# Patient Record
Sex: Male | Born: 1955 | Race: Black or African American | Hispanic: No | Marital: Married | Smoking: Former smoker
Health system: Southern US, Community
[De-identification: ages and names within clinical notes are randomized; demographics above are authoritative.]

## PROBLEM LIST (undated history)

## (undated) DIAGNOSIS — E785 Hyperlipidemia, unspecified: Secondary | ICD-10-CM

## (undated) DIAGNOSIS — I10 Essential (primary) hypertension: Secondary | ICD-10-CM

## (undated) DIAGNOSIS — E119 Type 2 diabetes mellitus without complications: Secondary | ICD-10-CM

## (undated) DIAGNOSIS — N189 Chronic kidney disease, unspecified: Secondary | ICD-10-CM

## (undated) HISTORY — DX: Chronic kidney disease, unspecified: N18.9

## (undated) HISTORY — DX: Essential (primary) hypertension: I10

## (undated) HISTORY — PX: ESOPHAGOGASTRODUODENOSCOPY: SHX1529

## (undated) HISTORY — DX: Hyperlipidemia, unspecified: E78.5

## (undated) HISTORY — DX: Type 2 diabetes mellitus without complications: E11.9

---

## 1999-06-10 ENCOUNTER — Emergency Department (HOSPITAL_COMMUNITY): Admission: EM | Admit: 1999-06-10 | Discharge: 1999-06-11 | Payer: Self-pay

## 2000-12-12 ENCOUNTER — Inpatient Hospital Stay (HOSPITAL_COMMUNITY): Admission: EM | Admit: 2000-12-12 | Discharge: 2000-12-13 | Payer: Self-pay | Admitting: Orthopedic Surgery

## 2002-08-04 ENCOUNTER — Encounter: Payer: Self-pay | Admitting: Family Medicine

## 2002-08-04 ENCOUNTER — Encounter: Admission: RE | Admit: 2002-08-04 | Discharge: 2002-08-04 | Payer: Self-pay | Admitting: Family Medicine

## 2009-01-25 ENCOUNTER — Encounter: Admission: RE | Admit: 2009-01-25 | Discharge: 2009-01-25 | Payer: Self-pay | Admitting: Family Medicine

## 2009-11-11 ENCOUNTER — Encounter: Admission: RE | Admit: 2009-11-11 | Discharge: 2009-11-11 | Payer: Self-pay | Admitting: Gastroenterology

## 2010-09-02 NOTE — Op Note (Signed)
[facility]  Patient:    Jay Reynolds, Jay Reynolds Visit Number: [id number] MRN: [id number]          Service Type: SUR Location: [id number] Attending Physician:  Marlowe Kays Page Proc. Date: 12/11/00 Adm. Date:  12/11/2000                             Operative Report  PREOPERATIVE DIAGNOSIS:  Subacute tear, right Achilles tendon.  POSTOPERATIVE DIAGNOSIS:  Subacute tear, right Achilles tendon.  OPERATION PERFORMED:  Repair of subacute Achilles tendon tear, right.  SURGEON:  Illene Labrador. [place], M.D.  ASSISTANT:  Nurse.  ANESTHESIA:  General.  PATHOLOGY AND JUSTIFICATION FOR PROCEDURE:  He injured it playing basketball about four weeks ago because of pain and limitation of activity. His primary care physician sent him for an MRI which demonstrated a complete tear which was confirmed at surgery as discussed below.  DESCRIPTION OF PROCEDURE:  Satisfactory general anesthesia, pneumatic tourniquet applied and leg esmarched out while he was in the supine position. We then placed him in the prone position and I prepped the leg above the knee in case I had to do a V-Y plasty to close the defect. The right leg from knee to toes was draped in a sterile field. I marked out the incision starting medially to the heel cord and then coming up and gently turned a lazy S incision curving over to the midline just above the tear site. The wound was opened to about mid calf initially. The sural nerve was identified and protected. The rupture site was identified. There were two strips of roughly 1-2 cm tendinous tissue medially and laterally which were in continuity but the rest of the tendon was completely disrupted with the bulbous end to the distal portion. I first placed the ankle in a equinus to see if the gap from the tear would close down and it would, so I then elected not to do the slide procedure with the V-Y plasty. I did extend the incision proximally  enough that I had adequate tendinous tissue for grafting later on as discussed below. I detached the medial and lateral slips of tendon distally and then flayed open the proximal stump and then placed the distal bulb up into the flayed open proximal portion after having freed up both sides of the Achilles tendon to get maximum excursion. I then stabilized this with an initial #1 Ethibond Kessler stitch. I then supplemented this by advancing the medial and lateral flaps of tendinous tissue distally and suturing them to each other and to the main body of the Achilles tendon with multiple interrupted #1 Ethibond sutures. Next, I marked out two flaps of tendinous tissue starting 2 cm from the repair site and extending about 8 cm proximally with each tongue of tissue being a centimeter wide. These were then rotated downward and I closed the interval of the donor site in each case with running #1 Vicryl. I then reattached these two strips which were long enough to go well past the repair site medially and laterally and also covering the posterior surface with multiple interrupted and running #1 Vicryl. This gave a very nice thick supported repair. The wound was then irrigated with sterile saline. He was given 3 mg of Toradol IV. What was left of the tendon sheath, I was able to approximate with interrupted 2-0 Vicryl with the superficial subcutaneous tissue with 3-0 Vicryl and staples to the  skin. Betadine, Adaptic ABD pads and a well padded short leg plaster cast were then applied with the ankle in maximum equinus. The tourniquet was then released. The patient tolerated the procedure well and at the time of this dictation was on his way to the recovery room in satisfactory condition with no known complications. Attending Physician:  Joaquin Courts DD:  12/11/00 TD:  12/11/00 Job: [id number]

## 2013-01-28 ENCOUNTER — Other Ambulatory Visit: Payer: Self-pay | Admitting: Family Medicine

## 2013-01-28 ENCOUNTER — Ambulatory Visit
Admission: RE | Admit: 2013-01-28 | Discharge: 2013-01-28 | Disposition: A | Discharge status: Home / Self Care | Payer: 59 | Source: Ambulatory Visit | Attending: Family Medicine | Admitting: Family Medicine

## 2013-01-28 DIAGNOSIS — S6991XA Unspecified injury of right wrist, hand and finger(s), initial encounter: Secondary | ICD-10-CM

## 2015-10-14 DIAGNOSIS — Z0000 Encounter for general adult medical examination without abnormal findings: Secondary | ICD-10-CM | POA: Diagnosis not present

## 2015-10-14 DIAGNOSIS — I10 Essential (primary) hypertension: Secondary | ICD-10-CM | POA: Diagnosis not present

## 2015-10-14 DIAGNOSIS — E782 Mixed hyperlipidemia: Secondary | ICD-10-CM | POA: Diagnosis not present

## 2015-10-14 DIAGNOSIS — E1122 Type 2 diabetes mellitus with diabetic chronic kidney disease: Secondary | ICD-10-CM | POA: Diagnosis not present

## 2015-10-14 DIAGNOSIS — Z125 Encounter for screening for malignant neoplasm of prostate: Secondary | ICD-10-CM | POA: Diagnosis not present

## 2015-11-04 DIAGNOSIS — R0789 Other chest pain: Secondary | ICD-10-CM | POA: Diagnosis not present

## 2015-11-04 DIAGNOSIS — I10 Essential (primary) hypertension: Secondary | ICD-10-CM | POA: Diagnosis not present

## 2015-12-13 DIAGNOSIS — I10 Essential (primary) hypertension: Secondary | ICD-10-CM | POA: Diagnosis not present

## 2016-06-13 DIAGNOSIS — I10 Essential (primary) hypertension: Secondary | ICD-10-CM | POA: Diagnosis not present

## 2016-06-13 DIAGNOSIS — N529 Male erectile dysfunction, unspecified: Secondary | ICD-10-CM | POA: Diagnosis not present

## 2016-12-11 DIAGNOSIS — Z79899 Other long term (current) drug therapy: Secondary | ICD-10-CM | POA: Diagnosis not present

## 2016-12-11 DIAGNOSIS — E1122 Type 2 diabetes mellitus with diabetic chronic kidney disease: Secondary | ICD-10-CM | POA: Diagnosis not present

## 2016-12-11 DIAGNOSIS — Z0000 Encounter for general adult medical examination without abnormal findings: Secondary | ICD-10-CM | POA: Diagnosis not present

## 2017-01-10 DIAGNOSIS — D126 Benign neoplasm of colon, unspecified: Secondary | ICD-10-CM | POA: Diagnosis not present

## 2017-01-10 DIAGNOSIS — K5730 Diverticulosis of large intestine without perforation or abscess without bleeding: Secondary | ICD-10-CM | POA: Diagnosis not present

## 2017-01-10 DIAGNOSIS — Z86010 Personal history of colonic polyps: Secondary | ICD-10-CM | POA: Diagnosis not present

## 2017-01-10 DIAGNOSIS — K640 First degree hemorrhoids: Secondary | ICD-10-CM | POA: Diagnosis not present

## 2017-01-16 DIAGNOSIS — D126 Benign neoplasm of colon, unspecified: Secondary | ICD-10-CM | POA: Diagnosis not present

## 2017-06-05 DIAGNOSIS — M25561 Pain in right knee: Secondary | ICD-10-CM | POA: Diagnosis not present

## 2017-06-05 DIAGNOSIS — S83411A Sprain of medial collateral ligament of right knee, initial encounter: Secondary | ICD-10-CM | POA: Diagnosis not present

## 2017-06-05 DIAGNOSIS — Z6832 Body mass index (BMI) 32.0-32.9, adult: Secondary | ICD-10-CM | POA: Diagnosis not present

## 2017-12-25 DIAGNOSIS — I10 Essential (primary) hypertension: Secondary | ICD-10-CM | POA: Diagnosis not present

## 2017-12-25 DIAGNOSIS — E1122 Type 2 diabetes mellitus with diabetic chronic kidney disease: Secondary | ICD-10-CM | POA: Diagnosis not present

## 2017-12-25 DIAGNOSIS — Z0000 Encounter for general adult medical examination without abnormal findings: Secondary | ICD-10-CM | POA: Diagnosis not present

## 2017-12-25 DIAGNOSIS — E782 Mixed hyperlipidemia: Secondary | ICD-10-CM | POA: Diagnosis not present

## 2018-01-16 DIAGNOSIS — R7989 Other specified abnormal findings of blood chemistry: Secondary | ICD-10-CM | POA: Diagnosis not present

## 2018-01-16 DIAGNOSIS — R002 Palpitations: Secondary | ICD-10-CM | POA: Diagnosis not present

## 2019-02-13 DIAGNOSIS — E782 Mixed hyperlipidemia: Secondary | ICD-10-CM | POA: Diagnosis not present

## 2019-02-13 DIAGNOSIS — I10 Essential (primary) hypertension: Secondary | ICD-10-CM | POA: Diagnosis not present

## 2019-02-13 DIAGNOSIS — Z0000 Encounter for general adult medical examination without abnormal findings: Secondary | ICD-10-CM | POA: Diagnosis not present

## 2019-02-13 DIAGNOSIS — E1122 Type 2 diabetes mellitus with diabetic chronic kidney disease: Secondary | ICD-10-CM | POA: Diagnosis not present

## 2019-02-20 ENCOUNTER — Other Ambulatory Visit: Payer: Self-pay

## 2019-02-20 ENCOUNTER — Encounter: Payer: Self-pay | Admitting: Cardiology

## 2019-02-20 ENCOUNTER — Ambulatory Visit (INDEPENDENT_AMBULATORY_CARE_PROVIDER_SITE_OTHER): Payer: BC Managed Care – PPO | Admitting: Cardiology

## 2019-02-20 VITALS — BP 122/88 | HR 94 | Ht 72.0 in | Wt 236.0 lb

## 2019-02-20 DIAGNOSIS — R002 Palpitations: Secondary | ICD-10-CM | POA: Diagnosis not present

## 2019-02-20 DIAGNOSIS — E785 Hyperlipidemia, unspecified: Secondary | ICD-10-CM | POA: Diagnosis not present

## 2019-02-20 DIAGNOSIS — R072 Precordial pain: Secondary | ICD-10-CM

## 2019-02-20 DIAGNOSIS — I10 Essential (primary) hypertension: Secondary | ICD-10-CM

## 2019-02-20 NOTE — Progress Notes (Signed)
Cardiology Office Note:    Date:  02/20/2019   ID:  Jay Reynolds, DOB [birth year], MRN [id number]  PCP:  Mila Palmer, MD  Cardiologist:  No primary care provider on file.  Electrophysiologist:  None   Referring MD: Mila Palmer, MD   Chief Complaint  Patient presents with  . Chest Pain  . Palpitations    History of Present Illness:    Jay Reynolds is a 63 y.o. male with a hx of type 2 diabetes, CKD stage II, hyperlipidemia, hypertension who is referred by Dr. Paulino Rily for evaluation of palpitations and chest pain.  Reports that he has been getting a sensation in his chest that he describes as fluttering.  Has been occurring for past 1 year.  Occurs every day.  States that it feels like his chest is fluttering.  Occurs at rest.  However, he states that he recently had different type of chest pain that occurred with exertion.  Describes that he had burning in his chest that occurred when he went for a walk.  He walks for 3 miles every day.  Denies any shortness of breath.  He has diet-controlled diabetes.  He reports that his blood pressure has been controlled on his current regimen but can be as high as 140s over 90s when he checks at home.  He quit smoking in 1982, smoked 0.5 packs/day x 7 years.  In regards to family history, he reports that his father had CHF and his brother had a CABG in his 29s.    Past Medical History:  Diagnosis Date  . Chronic kidney disease   . Diabetes mellitus without complication (HCC)   . Hyperlipidemia   . Hypertension     History reviewed. No pertinent surgical history.  Current Medications: Current Meds  Medication Sig  . amLODipine-olmesartan (AZOR) 5-40 MG tablet amlodipine 5 mg-olmesartan 40 mg tablet  1 tablet daily  . hydrochlorothiazide (HYDRODIURIL) 25 MG tablet hydrochlorothiazide 25 mg tablet  1 tablet daily  . Milk Thistle 150 MG CAPS milk thistle 150 mg capsule  Take 1 capsule every day by oral route.     Allergies:    Patient has no known allergies.   Social History   Socioeconomic History  . Marital status: Married    Spouse name: Not on file  . Number of children: Not on file  . Years of education: Not on file  . Highest education level: Not on file  Occupational History  . Not on file  Social Needs  . Financial resource strain: Not on file  . Food insecurity    Worry: Not on file    Inability: Not on file  . Transportation needs    Medical: Not on file    Non-medical: Not on file  Tobacco Use  . Smoking status: Former Smoker    Quit date: 1982    Years since quitting: 38.8  Substance and Sexual Activity  . Alcohol use: Not on file  . Drug use: Not on file  . Sexual activity: Not on file  Lifestyle  . Physical activity    Days per week: Not on file    Minutes per session: Not on file  . Stress: Not on file  Relationships  . Social Musician on phone: Not on file    Gets together: Not on file    Attends religious service: Not on file    Active member of club or organization: Not on file  Attends meetings of clubs or organizations: Not on file    Relationship status: Not on file  Other Topics Concern  . Not on file  Social History Narrative  . Not on file     Family History: Father had CHF and his brother had a CABG in his 83s.  ROS:   Please see the history of present illness.    All other systems reviewed and are negative.  EKGs/Labs/Other Studies Reviewed:    The following studies were reviewed today:  EKG:  EKG is ordered today.  The ekg ordered today demonstrates NSR, rate 94, PVC  Recent Labs: No results found for requested labs within last 8760 hours.  Recent Lipid Panel No results found for: CHOL, TRIG, HDL, CHOLHDL, VLDL, LDLCALC, LDLDIRECT  Physical Exam:    VS:  BP 122/88   Pulse 94   Ht 6' (1.829 m)   Wt 236 lb (107 kg)   SpO2 96%   BMI 32.01 kg/m     Wt Readings from Last 3 Encounters:  02/20/19 236 lb (107 kg)     GEN: Well  nourished, well developed in no acute distress HEENT: Normal NECK: No JVD LYMPHATICS: No lymphadenopathy CARDIAC: RRR, no murmurs, rubs, gallops RESPIRATORY:  Clear to auscultation without rales, wheezing or rhonchi  ABDOMEN: Soft, non-tender, non-distended MUSCULOSKELETAL:  No edema; No deformity  SKIN: Warm and dry NEUROLOGIC:  Alert and oriented x 3 PSYCHIATRIC:  Normal affect   ASSESSMENT:    1. Precordial pain   2. Palpitations   3. Essential hypertension   4. Hyperlipidemia, unspecified hyperlipidemia type    PLAN:    In order of problems listed above:  Chest pain: Atypical in description, as describes a burning sensation that occurred with exertion.  However given the pain was exertional, and considering his risk factors (including age, hypertension, type 2 diabetes, hyperlipidemia), warrants further evaluation for obstructive coronary disease -Stress MPI -TTE  Palpitations: Describes a fluttering feeling in chest, concerning for arrhythmia.  Will check Zio patch x3 days  Hypertension: On amlodipine olmesartan 5-40 mg daily, hydrochlorothiazide 25 mg daily.  Appears controlled  T2DM: A1c 6.6.  Diet controlled  Hyperlipidemia: LDL 112, not on a statin.  Given history of diabetes, will start on atorvastatin 40 mg daily  RTC in 3 months  Medication Adjustments/Labs and Tests Ordered: Current medicines are reviewed at length with the patient today.  Concerns regarding medicines are outlined above.  Orders Placed This Encounter  Procedures  . Myocardial Perfusion Imaging  . LONG TERM MONITOR (3-14 DAYS)  . EKG 12-Lead  . ECHOCARDIOGRAM COMPLETE   No orders of the defined types were placed in this encounter.   Patient Instructions  Medication Instructions:  Continue same medications   Lab Work: None  ordered   Testing/Procedures:  Schedule Echo  Schedule Lexiscan Myoview  Schedule 3 day Zio Monitor   Follow-Up: At [facility], you and your health  needs are our priority.  As part of our continuing mission to provide you with exceptional heart care, we have created designated Provider Care Teams.  These Care Teams include your primary Cardiologist (physician) and Advanced Practice Providers (APPs -  Physician Assistants and Nurse Practitioners) who all work together to provide you with the care you need, when you need it.  Your next appointment:  3 months   The format for your next appointment:  Office   Provider:  Dr.Naquita Nappier      Signed, Donato Heinz, MD  02/20/2019  9:18 PM    [facility]

## 2019-02-20 NOTE — Patient Instructions (Signed)
Medication Instructions:  Continue same medications   Lab Work: None  ordered   Testing/Procedures:  Schedule Echo  Schedule Lexiscan Myoview  Schedule 3 day Zio Monitor   Follow-Up: At [facility], you and your health needs are our priority.  As part of our continuing mission to provide you with exceptional heart care, we have created designated Provider Care Teams.  These Care Teams include your primary Cardiologist (physician) and Advanced Practice Providers (APPs -  Physician Assistants and Nurse Practitioners) who all work together to provide you with the care you need, when you need it.  Your next appointment:  3 months   The format for your next appointment:  Office   Provider:  Dr.Schumann

## 2019-02-27 ENCOUNTER — Telehealth (HOSPITAL_COMMUNITY): Payer: Self-pay

## 2019-02-27 ENCOUNTER — Ambulatory Visit (HOSPITAL_COMMUNITY): Payer: BC Managed Care – PPO | Attending: Cardiology

## 2019-02-27 ENCOUNTER — Other Ambulatory Visit: Payer: Self-pay

## 2019-02-27 DIAGNOSIS — R002 Palpitations: Secondary | ICD-10-CM | POA: Insufficient documentation

## 2019-02-27 DIAGNOSIS — R072 Precordial pain: Secondary | ICD-10-CM

## 2019-02-27 NOTE — Telephone Encounter (Signed)
Encounter complete. 

## 2019-03-04 ENCOUNTER — Other Ambulatory Visit: Payer: Self-pay

## 2019-03-04 ENCOUNTER — Ambulatory Visit (HOSPITAL_COMMUNITY)
Admission: RE | Admit: 2019-03-04 | Payer: BC Managed Care – PPO | Source: Ambulatory Visit | Attending: Cardiology | Admitting: Cardiology

## 2019-03-04 ENCOUNTER — Encounter: Payer: Self-pay | Admitting: *Deleted

## 2019-03-05 ENCOUNTER — Telehealth: Payer: Self-pay | Admitting: *Deleted

## 2019-03-05 NOTE — Telephone Encounter (Signed)
3 day ZIO XT long term holter monitor to be mailed to the patients home.  Please apply monitor after Nuclear Medicine study on 03/11/2019 as the monitor cannot be removed and re-applied for other procedures.  Instructions included in the monitor kit.

## 2019-03-06 ENCOUNTER — Telehealth (HOSPITAL_COMMUNITY): Payer: Self-pay

## 2019-03-06 NOTE — Telephone Encounter (Signed)
Encounter complete. 

## 2019-03-11 ENCOUNTER — Other Ambulatory Visit: Payer: Self-pay

## 2019-03-11 ENCOUNTER — Ambulatory Visit (HOSPITAL_COMMUNITY)
Admission: RE | Admit: 2019-03-11 | Discharge: 2019-03-11 | Disposition: A | Discharge status: Home / Self Care | Payer: BC Managed Care – PPO | Source: Ambulatory Visit | Attending: Cardiology | Admitting: Cardiology

## 2019-03-11 DIAGNOSIS — R072 Precordial pain: Secondary | ICD-10-CM | POA: Insufficient documentation

## 2019-03-11 LAB — MYOCARDIAL PERFUSION IMAGING
LV dias vol: 115 mL (ref 62–150)
LV sys vol: 61 mL
Peak HR: 98 {beats}/min
Rest HR: 61 {beats}/min
SDS: 1
SRS: 2
SSS: 3
TID: 1.24

## 2019-03-11 MED ORDER — TECHNETIUM TC 99M TETROFOSMIN IV KIT
9.1000 | PACK | Freq: Once | INTRAVENOUS | Status: AC | PRN
  Administered 2019-03-11: 9.1 via INTRAVENOUS
  Filled 2019-03-11: qty 10

## 2019-03-11 MED ORDER — AMINOPHYLLINE 25 MG/ML IV SOLN
150.0000 mg | Freq: Once | INTRAVENOUS | Status: AC
  Administered 2019-03-11: 150 mg via INTRAVENOUS

## 2019-03-11 MED ORDER — TECHNETIUM TC 99M TETROFOSMIN IV KIT
28.8000 | PACK | Freq: Once | INTRAVENOUS | Status: AC | PRN
  Administered 2019-03-11: 28.8 via INTRAVENOUS
  Filled 2019-03-11: qty 29

## 2019-03-11 MED ORDER — REGADENOSON 0.4 MG/5ML IV SOLN
0.4000 mg | Freq: Once | INTRAVENOUS | Status: AC
  Administered 2019-03-11: 0.4 mg via INTRAVENOUS

## 2019-03-14 ENCOUNTER — Ambulatory Visit (INDEPENDENT_AMBULATORY_CARE_PROVIDER_SITE_OTHER): Payer: BC Managed Care – PPO

## 2019-03-14 DIAGNOSIS — R072 Precordial pain: Secondary | ICD-10-CM

## 2019-03-14 DIAGNOSIS — R002 Palpitations: Secondary | ICD-10-CM

## 2019-03-17 ENCOUNTER — Telehealth: Payer: Self-pay | Admitting: Cardiology

## 2019-03-17 NOTE — Telephone Encounter (Signed)
Patient returning call back for results

## 2019-03-17 NOTE — Telephone Encounter (Signed)
Pt updated with test results  

## 2019-03-17 NOTE — Telephone Encounter (Signed)
Patient is returning call about stress test results.

## 2019-03-29 DIAGNOSIS — R002 Palpitations: Secondary | ICD-10-CM | POA: Diagnosis not present

## 2019-03-29 DIAGNOSIS — R072 Precordial pain: Secondary | ICD-10-CM | POA: Diagnosis not present

## 2019-06-02 ENCOUNTER — Ambulatory Visit: Payer: BC Managed Care – PPO | Admitting: Cardiology

## 2019-06-29 NOTE — Progress Notes (Signed)
Cardiology Office Note:    Date:  07/01/2019   ID:  Jay Reynolds, DOB Jan 03, 1956, MRN [id number]  PCP:  Jonathon Jordan, MD  Cardiologist:  No primary care provider on file.  Electrophysiologist:  None   Referring MD: Jonathon Jordan, MD   No chief complaint on file.   History of Present Illness:    Jay Reynolds is a 64 y.o. male with a hx of type 2 diabetes, CKD stage II, hyperlipidemia, hypertension who presents for follow-up.  He was referred by Dr. Stephanie Acre for evaluation of palpitations and chest pain.  Reports that he has been getting a sensation in his chest that he describes as fluttering.  Has been occurring for past 1 year.  Occurs every day.  States that it feels like his chest is fluttering.  Occurs at rest.  However, he states that he recently had different type of chest pain that occurred with exertion.  Describes that he had burning in his chest that occurred when he went for a walk.  He walks for 3 miles every day.  Denies any shortness of breath.  He has diet-controlled diabetes.  He reports that his blood pressure has been controlled on his current regimen but can be as high as 140s over 90s when he checks at home.  He quit smoking in 1982, smoked 0.5 packs/day x 7 years.  In regards to family history, he reports that his father had CHF and his brother had a CABG in his 65s.  Lexiscan Myoview on 03/11/2019 showed EF 47%, fixed anteroseptal perfusion defect (suspected to be artifact given normal wall motion in this area).  TTE 02/27/2019 showed EF 60 to 65%, no significant valvular disease.  Zio patch x3 days on 04/04/2019 showed no significant arrhythmias, occasional PVCs (1.1% of beats).  Since last clinic visit, he reports that his chest pain has improved.  Does continue to have occasional chest pain, which he describes as tightness in his chest that occurs after eating.  Reports that Tums helps with pain.  States that his palpitations have improved, denies any recent  palpitations.  He has been walking 3 miles 5 days/week and denies any exertional chest pain.    Past Medical History:  Diagnosis Date  . Chronic kidney disease   . Diabetes mellitus without complication ([place])   . Hyperlipidemia   . Hypertension     History reviewed. No pertinent surgical history.  Current Medications: Current Meds  Medication Sig  . amLODipine-olmesartan (AZOR) 5-40 MG tablet amlodipine 5 mg-olmesartan 40 mg tablet  1 tablet daily  . aspirin EC 81 MG tablet Take 81 mg by mouth daily.  . hydrochlorothiazide (HYDRODIURIL) 25 MG tablet hydrochlorothiazide 25 mg tablet  1 tablet daily  . Milk Thistle 150 MG CAPS milk thistle 150 mg capsule  Take 1 capsule every day by oral route.  . Misc Natural Products (GLUCOSAMINE CHOND CMP ADVANCED PO) Take by mouth.  . Multiple Vitamin (MULTIVITAMIN PO) Take by mouth.     Allergies:   Antihistamines, diphenhydramine-type   Social History   Socioeconomic History  . Marital status: Married    Spouse name: Not on file  . Number of children: Not on file  . Years of education: Not on file  . Highest education level: Not on file  Occupational History  . Not on file  Tobacco Use  . Smoking status: Former Smoker    Quit date: 1982    Years since quitting: 39.2  . Smokeless tobacco:  Never Used  Substance and Sexual Activity  . Alcohol use: Not on file  . Drug use: Never  . Sexual activity: Not on file  Other Topics Concern  . Not on file  Social History Narrative  . Not on file   Social Determinants of Health   Financial Resource Strain:   . Difficulty of Paying Living Expenses:   Food Insecurity:   . Worried About Programme researcher, broadcasting/film/video in the Last Year:   . Barista in the Last Year:   Transportation Needs:   . Freight forwarder (Medical):   Marland Kitchen Lack of Transportation (Non-Medical):   Physical Activity:   . Days of Exercise per Week:   . Minutes of Exercise per Session:   Stress:   . Feeling of  Stress :   Social Connections:   . Frequency of Communication with Friends and Family:   . Frequency of Social Gatherings with Friends and Family:   . Attends Religious Services:   . Active Member of Clubs or Organizations:   . Attends Banker Meetings:   Marland Kitchen Marital Status:      Family History: Father had CHF and his brother had a CABG in his 58s.  ROS:   Please see the history of present illness.    All other systems reviewed and are negative.  EKGs/Labs/Other Studies Reviewed:    The following studies were reviewed today:  EKG:  EKG is ordered today.  The ekg ordered today demonstrates NSR, rate 94, PVC  Cardiac monitor 04/04/2019:  No significant arrhythmias detected  Occasional PVCs (1.1% of beats), with episode of bigeminy lasting 6.5 seconds  No patient triggered events   3 days of data recorded on Zio monitor. Patient had a min HR of 58 bpm, max HR of 140 bpm, and avg HR of 90 bpm. Predominant underlying rhythm was Sinus Rhythm. No VT, SVT, atrial fibrillation, high degree block, or pauses noted. Isolated atrial ectopy was rare (<1%).  Ventricular ectopy was occasional (1.1%).  Ventricular bigemny was present, longest lasting 6.5 seconds.   There were 0 triggered events. No significant arrhythmias detected.  Lexiscan Myoview 03/11/2019:  There was no ST segment deviation noted during stress.  Nuclear stress EF: 47%.  The left ventricular ejection fraction is mildly decreased (45-54%).  Defect 1: There is a medium fixed defect of mild severity present in the basal anteroseptal, mid anteroseptal, apical septal and apex location.  Findings consistent with prior myocardial infarction versus artifact. Normal wall motion suggests more likely artifact  This is an intermediate risk study given decreased systolic function. No ischemia.   Fixed anteroseptal perfusion defect.  Normal wall motion in this area suggests artifact.  Mild systolic dysfunction, but  normal function on recent TTE   TTE 02/27/19: 1. The average left ventricular global longitudinal strain is normal at  -18.6 %.  2. Left ventricular ejection fraction, by visual estimation, is 60 to  65%. The left ventricle has normal function. There is no left ventricular  hypertrophy.  3. Left ventricular diastolic parameters are indeterminate.  4. Global right ventricle has normal systolic function.The right  ventricular size is normal. No increase in right ventricular wall  thickness.  5. Left atrial size was normal.  6. Right atrial size was normal.  7. The mitral valve is normal in structure. Trace mitral valve  regurgitation. No evidence of mitral stenosis.  8. The tricuspid valve is normal in structure. Tricuspid valve  regurgitation is not  demonstrated.  9. The aortic valve is normal in structure. Aortic valve regurgitation is  not visualized. No evidence of aortic valve sclerosis or stenosis.  10. The pulmonic valve was normal in structure. Pulmonic valve  regurgitation is not visualized.  11. The inferior vena cava is normal in size with greater than 50%  respiratory variability, suggesting right atrial pressure of 3 mmHg.   Recent Labs: No results found for requested labs within last 8760 hours.  Recent Lipid Panel No results found for: CHOL, TRIG, HDL, CHOLHDL, VLDL, LDLCALC, LDLDIRECT  Physical Exam:    VS:  BP 130/84   Pulse 72   Temp 97.9 F (36.6 C)   Ht 6' (1.829 m)   Wt 240 lb (108.9 kg)   SpO2 96%   BMI 32.55 kg/m     Wt Readings from Last 3 Encounters:  07/01/19 240 lb (108.9 kg)  03/11/19 236 lb (107 kg)  02/20/19 236 lb (107 kg)     GEN: Well nourished, well developed in no acute distress HEENT: Normal NECK: No JVD LYMPHATICS: No lymphadenopathy CARDIAC: RRR, no murmurs, rubs, gallops RESPIRATORY:  Clear to auscultation without rales, wheezing or rhonchi  ABDOMEN: Soft, non-tender, non-distended MUSCULOSKELETAL:  No edema; No  deformity  SKIN: Warm and dry NEUROLOGIC:  Alert and oriented x 3 PSYCHIATRIC:  Normal affect   ASSESSMENT:    1. Precordial pain   2. Palpitations   3. Essential hypertension   4. Hyperlipidemia, unspecified hyperlipidemia type    PLAN:    In order of problems listed above:  Chest pain: Atypical in description.  No ischemia on Lexiscan Myoview.  TTE shows no significant abnormalities.  Suspect GERD, will start on omeprazole 20 mg daily  Palpitations: Describes a fluttering feeling in chest.  Zio patch showed no significant arrhythmias, occasional PVCs (1.1% of beats).  He reports no recent symptoms.  Hypertension: On amlodipine olmesartan 5-40 mg daily, hydrochlorothiazide 25 mg daily.  Appears controlled  T2DM: A1c 6.6.  Diet controlled  Hyperlipidemia: LDL 112.  Given history of diabetes, start atorvastatin 40 mg daily   RTC in 3 months  Medication Adjustments/Labs and Tests Ordered: Current medicines are reviewed at length with the patient today.  Concerns regarding medicines are outlined above.  No orders of the defined types were placed in this encounter.  Meds ordered this encounter  Medications  . atorvastatin (LIPITOR) 40 MG tablet    Sig: Take 1 tablet (40 mg total) by mouth daily.    Dispense:  90 tablet    Refill:  3  . omeprazole (PRILOSEC) 20 MG capsule    Sig: Take 1 capsule (20 mg total) by mouth daily.    Dispense:  90 capsule    Refill:  3    Patient Instructions  Medication Instructions:  START atorvastatin (Lipitor) 40 mg daily START omeprazole 20 mg daily  *If you need a refill on your cardiac medications before your next appointment, please call your pharmacy*   Lab Work: NONE  Testing/Procedures: NONE  Follow-Up: At [facility], you and your health needs are our priority.  As part of our continuing mission to provide you with exceptional heart care, we have created designated Provider Care Teams.  These Care Teams include your  primary Cardiologist (physician) and Advanced Practice Providers (APPs -  Physician Assistants and Nurse Practitioners) who all work together to provide you with the care you need, when you need it.  We recommend signing up for the patient portal called "  MyChart".  Sign up information is provided on this After Visit Summary.  MyChart is used to connect with patients for Virtual Visits (Telemedicine).  Patients are able to view lab/test results, encounter notes, upcoming appointments, etc.  Non-urgent messages can be sent to your provider as well.   To learn more about what you can do with MyChart, go to [email or web address].    Your next appointment:   3 month(s)  The format for your next appointment:   In Person  Provider:   Epifanio Lesches, MD        Signed, Little Ishikawa, MD  07/01/2019 9:24 AM    [facility]

## 2019-07-01 ENCOUNTER — Ambulatory Visit (INDEPENDENT_AMBULATORY_CARE_PROVIDER_SITE_OTHER): Payer: BC Managed Care – PPO | Admitting: Cardiology

## 2019-07-01 ENCOUNTER — Other Ambulatory Visit: Payer: Self-pay

## 2019-07-01 ENCOUNTER — Encounter: Payer: Self-pay | Admitting: Cardiology

## 2019-07-01 VITALS — BP 130/84 | HR 72 | Temp 97.9°F | Ht 72.0 in | Wt 240.0 lb

## 2019-07-01 DIAGNOSIS — I10 Essential (primary) hypertension: Secondary | ICD-10-CM | POA: Diagnosis not present

## 2019-07-01 DIAGNOSIS — R002 Palpitations: Secondary | ICD-10-CM

## 2019-07-01 DIAGNOSIS — R072 Precordial pain: Secondary | ICD-10-CM

## 2019-07-01 DIAGNOSIS — E785 Hyperlipidemia, unspecified: Secondary | ICD-10-CM | POA: Diagnosis not present

## 2019-07-01 MED ORDER — OMEPRAZOLE 20 MG PO CPDR: 20.0000 mg | DELAYED_RELEASE_CAPSULE | Freq: Every day | ORAL | 3 refills | Status: DC

## 2019-07-01 MED ORDER — ATORVASTATIN CALCIUM 40 MG PO TABS: 40.0000 mg | ORAL_TABLET | Freq: Every day | ORAL | 3 refills | Status: DC

## 2019-07-01 NOTE — Patient Instructions (Signed)
Medication Instructions:  START atorvastatin (Lipitor) 40 mg daily START omeprazole 20 mg daily  *If you need a refill on your cardiac medications before your next appointment, please call your pharmacy*   Lab Work: NONE  Testing/Procedures: NONE  Follow-Up: At [facility], you and your health needs are our priority.  As part of our continuing mission to provide you with exceptional heart care, we have created designated Provider Care Teams.  These Care Teams include your primary Cardiologist (physician) and Advanced Practice Providers (APPs -  Physician Assistants and Nurse Practitioners) who all work together to provide you with the care you need, when you need it.  We recommend signing up for the patient portal called "MyChart".  Sign up information is provided on this After Visit Summary.  MyChart is used to connect with patients for Virtual Visits (Telemedicine).  Patients are able to view lab/test results, encounter notes, upcoming appointments, etc.  Non-urgent messages can be sent to your provider as well.   To learn more about what you can do with MyChart, go to [email or web address].    Your next appointment:   3 month(s)  The format for your next appointment:   In Person  Provider:   Epifanio Lesches, MD

## 2019-09-28 NOTE — Progress Notes (Signed)
Cardiology Office Note:    Date:  09/28/2019   ID:  Hazel Sams, DOB 1955/07/28, MRN [id number]  PCP:  Mila Palmer, MD  Cardiologist:  No primary care provider on file.  Electrophysiologist:  None   Referring MD: Mila Palmer, MD   No chief complaint on file.   History of Present Illness:    Jay Reynolds is a 64 y.o. male with a hx of type 2 diabetes, CKD stage II, hyperlipidemia, hypertension who presents for follow-up.  He was referred by Dr. Paulino Rily for evaluation of palpitations and chest pain.  Reports that he has been getting a sensation in his chest that he describes as fluttering.  Has been occurring for past 1 year.  Occurs every day.  States that it feels like his chest is fluttering.  Occurs at rest.  However, he states that he recently had different type of chest pain that occurred with exertion.  Describes that he had burning in his chest that occurred when he went for a walk.  He walks for 3 miles every day.  Denies any shortness of breath.  He has diet-controlled diabetes.  He reports that his blood pressure has been controlled on his current regimen but can be as high as 140s over 90s when he checks at home.  He quit smoking in 1982, smoked 0.5 packs/day x 7 years.  In regards to family history, he reports that his father had CHF and his brother had a CABG in his 23s.  Lexiscan Myoview on 03/11/2019 showed EF 47%, fixed anteroseptal perfusion defect (suspected to be artifact given normal wall motion in this area).  TTE 02/27/2019 showed EF 60 to 65%, no significant valvular disease.  Zio patch x3 days on 04/04/2019 showed no significant arrhythmias, occasional PVCs (1.1% of beats).  Since last clinic visit, he reports that he continues to have chest pain occurring nearly every day.  States that it feels like indigestion.  Still has palpitations several times per week lasting 1 to 2 minutes.  He continues to walk 3 miles 5 days/week.  Denies any exertional  symptoms.  Denies any lightheadedness or syncope.  Reports BP has been normal when he checks at home.  He did not start taking his statin or PPI as prescribed.    Past Medical History:  Diagnosis Date  . Chronic kidney disease   . Diabetes mellitus without complication (HCC)   . Hyperlipidemia   . Hypertension     No past surgical history on file.  Current Medications: No outpatient medications have been marked as taking for the 10/01/19 encounter (Appointment) with Little Ishikawa, MD.     Allergies:   Antihistamines, diphenhydramine-type   Social History   Socioeconomic History  . Marital status: Married    Spouse name: Not on file  . Number of children: Not on file  . Years of education: Not on file  . Highest education level: Not on file  Occupational History  . Not on file  Tobacco Use  . Smoking status: Former Smoker    Quit date: 1982    Years since quitting: 39.4  . Smokeless tobacco: Never Used  Vaping Use  . Vaping Use: Never used  Substance and Sexual Activity  . Alcohol use: Not on file  . Drug use: Never  . Sexual activity: Not on file  Other Topics Concern  . Not on file  Social History Narrative  . Not on file   Social Determinants of Health  Financial Resource Strain:   . Difficulty of Paying Living Expenses:   Food Insecurity:   . Worried About Charity fundraiser in the Last Year:   . Arboriculturist in the Last Year:   Transportation Needs:   . Film/video editor (Medical):   Marland Kitchen Lack of Transportation (Non-Medical):   Physical Activity:   . Days of Exercise per Week:   . Minutes of Exercise per Session:   Stress:   . Feeling of Stress :   Social Connections:   . Frequency of Communication with Friends and Family:   . Frequency of Social Gatherings with Friends and Family:   . Attends Religious Services:   . Active Member of Clubs or Organizations:   . Attends Archivist Meetings:   Marland Kitchen Marital Status:       Family History: Father had CHF and his brother had a CABG in his 59s.  ROS:   Please see the history of present illness.    All other systems reviewed and are negative.  EKGs/Labs/Other Studies Reviewed:    The following studies were reviewed today:  EKG:  EKG is ordered today.  The ekg ordered today demonstrates NSR, rate 70, no ST/T abnormalities  Cardiac monitor 04/04/2019:  No significant arrhythmias detected  Occasional PVCs (1.1% of beats), with episode of bigeminy lasting 6.5 seconds  No patient triggered events   3 days of data recorded on Zio monitor. Patient had a min HR of 58 bpm, max HR of 140 bpm, and avg HR of 90 bpm. Predominant underlying rhythm was Sinus Rhythm. No VT, SVT, atrial fibrillation, high degree block, or pauses noted. Isolated atrial ectopy was rare (<1%).  Ventricular ectopy was occasional (1.1%).  Ventricular bigemny was present, longest lasting 6.5 seconds.   There were 0 triggered events. No significant arrhythmias detected.  Lexiscan Myoview 03/11/2019:  There was no ST segment deviation noted during stress.  Nuclear stress EF: 47%.  The left ventricular ejection fraction is mildly decreased (45-54%).  Defect 1: There is a medium fixed defect of mild severity present in the basal anteroseptal, mid anteroseptal, apical septal and apex location.  Findings consistent with prior myocardial infarction versus artifact. Normal wall motion suggests more likely artifact  This is an intermediate risk study given decreased systolic function. No ischemia.   Fixed anteroseptal perfusion defect.  Normal wall motion in this area suggests artifact.  Mild systolic dysfunction, but normal function on recent TTE   TTE 02/27/19: 1. The average left ventricular global longitudinal strain is normal at  -18.6 %.  2. Left ventricular ejection fraction, by visual estimation, is 60 to  65%. The left ventricle has normal function. There is no left ventricular   hypertrophy.  3. Left ventricular diastolic parameters are indeterminate.  4. Global right ventricle has normal systolic function.The right  ventricular size is normal. No increase in right ventricular wall  thickness.  5. Left atrial size was normal.  6. Right atrial size was normal.  7. The mitral valve is normal in structure. Trace mitral valve  regurgitation. No evidence of mitral stenosis.  8. The tricuspid valve is normal in structure. Tricuspid valve  regurgitation is not demonstrated.  9. The aortic valve is normal in structure. Aortic valve regurgitation is  not visualized. No evidence of aortic valve sclerosis or stenosis.  10. The pulmonic valve was normal in structure. Pulmonic valve  regurgitation is not visualized.  11. The inferior vena cava is normal in  size with greater than 50%  respiratory variability, suggesting right atrial pressure of 3 mmHg.   Recent Labs: No results found for requested labs within last 8760 hours.  Recent Lipid Panel No results found for: CHOL, TRIG, HDL, CHOLHDL, VLDL, LDLCALC, LDLDIRECT  Physical Exam:    VS:  There were no vitals taken for this visit.    Wt Readings from Last 3 Encounters:  07/01/19 240 lb (108.9 kg)  03/11/19 236 lb (107 kg)  02/20/19 236 lb (107 kg)     GEN: Well nourished, well developed in no acute distress HEENT: Normal NECK: No JVD CARDIAC: RRR, no murmurs, rubs, gallops RESPIRATORY:  Clear to auscultation without rales, wheezing or rhonchi  ABDOMEN: Soft, non-tender, non-distended MUSCULOSKELETAL:  No edema; No deformity  SKIN: Warm and dry NEUROLOGIC:  Alert and oriented x 3 PSYCHIATRIC:  Normal affect   ASSESSMENT:    No diagnosis found. PLAN:    Chest pain: Atypical in description.  No ischemia on Lexiscan Myoview.  TTE shows no significant abnormalities.  Concern for GI etiology, will refer to GI for evaluation.  Palpitations: Describes a fluttering feeling in chest.  Zio patch showed  no significant arrhythmias, occasional PVCs (1.1% of beats).   Hypertension: On amlodipine ,olmesartan 5-40 mg daily, hydrochlorothiazide 25 mg daily.  Appears controlled  T2DM: A1c 6.6.  Diet controlled  Hyperlipidemia: LDL 112.  Given history of diabetes, started atorvastatin 40 mg daily on 07/01/2019.  He reports that he did not start taking atorvastatin, recommended starting.  RTC in 6 months  Medication Adjustments/Labs and Tests Ordered: Current medicines are reviewed at length with the patient today.  Concerns regarding medicines are outlined above.  No orders of the defined types were placed in this encounter.  No orders of the defined types were placed in this encounter.   There are no Patient Instructions on file for this visit.   Signed, Little Ishikawa, MD  09/28/2019 4:48 PM    [facility]

## 2019-10-01 ENCOUNTER — Encounter: Payer: Self-pay | Admitting: Gastroenterology

## 2019-10-01 ENCOUNTER — Encounter: Payer: Self-pay | Admitting: Cardiology

## 2019-10-01 ENCOUNTER — Other Ambulatory Visit: Payer: Self-pay

## 2019-10-01 ENCOUNTER — Ambulatory Visit (INDEPENDENT_AMBULATORY_CARE_PROVIDER_SITE_OTHER): Payer: BC Managed Care – PPO | Admitting: Cardiology

## 2019-10-01 VITALS — BP 126/88 | HR 70 | Ht 72.0 in | Wt 232.6 lb

## 2019-10-01 DIAGNOSIS — K219 Gastro-esophageal reflux disease without esophagitis: Secondary | ICD-10-CM | POA: Diagnosis not present

## 2019-10-01 DIAGNOSIS — R072 Precordial pain: Secondary | ICD-10-CM | POA: Diagnosis not present

## 2019-10-01 NOTE — Patient Instructions (Signed)
Medication Instructions:  Your physician recommends that you continue on your current medications as directed. Please refer to the Current Medication list given to you today.  *If you need a refill on your cardiac medications before your next appointment, please call your pharmacy*  Follow-Up: At [facility], you and your health needs are our priority.  As part of our continuing mission to provide you with exceptional heart care, we have created designated Provider Care Teams.  These Care Teams include your primary Cardiologist (physician) and Advanced Practice Providers (APPs -  Physician Assistants and Nurse Practitioners) who all work together to provide you with the care you need, when you need it.  We recommend signing up for the patient portal called "MyChart".  Sign up information is provided on this After Visit Summary.  MyChart is used to connect with patients for Virtual Visits (Telemedicine).  Patients are able to view lab/test results, encounter notes, upcoming appointments, etc.  Non-urgent messages can be sent to your provider as well.   To learn more about what you can do with MyChart, go to [email or web address].com.    Your next appointment:   6 month(s)  The format for your next appointment:   In Person  Provider:   Christopher Schumann, MD   Other Instructions You have been referred to: Gastroenterology   

## 2019-11-05 ENCOUNTER — Encounter: Payer: Self-pay | Admitting: Gastroenterology

## 2019-11-05 ENCOUNTER — Ambulatory Visit (INDEPENDENT_AMBULATORY_CARE_PROVIDER_SITE_OTHER): Payer: BC Managed Care – PPO | Admitting: Gastroenterology

## 2019-11-05 VITALS — BP 120/78 | HR 66 | Ht 72.0 in | Wt 231.0 lb

## 2019-11-05 DIAGNOSIS — R1013 Epigastric pain: Secondary | ICD-10-CM | POA: Insufficient documentation

## 2019-11-05 DIAGNOSIS — R0789 Other chest pain: Secondary | ICD-10-CM | POA: Diagnosis not present

## 2019-11-05 DIAGNOSIS — R6881 Early satiety: Secondary | ICD-10-CM | POA: Diagnosis not present

## 2019-11-05 MED ORDER — PANTOPRAZOLE SODIUM 40 MG PO TBEC: 40.0000 mg | DELAYED_RELEASE_TABLET | Freq: Every day | ORAL | 5 refills | Status: DC

## 2019-11-05 NOTE — Patient Instructions (Signed)
If you are age 64 or older, your body mass index should be between 23-30. Your Body mass index is 31.33 kg/m. If this is out of the aforementioned range listed, please consider follow up with your Primary Care Provider.  If you are age 2 or younger, your body mass index should be between 19-25. Your Body mass index is 31.33 kg/m. If this is out of the aformentioned range listed, please consider follow up with your Primary Care Provider.   You have been scheduled for an endoscopy. Please follow written instructions given to you at your visit today. If you use inhalers (even only as needed), please bring them with you on the day of your procedure.  START Pantoprazole 40 mg 1 tablet daily. This has been sent to [facility] on [address].  Follow up pending the results of your Endoscopy or as needed.

## 2019-11-05 NOTE — Progress Notes (Signed)
11/05/2019 Jay Reynolds [id number] 11-Mar-1956   HISTORY OF PRESENT ILLNESS: This is a 64 year old male who is new to our office.  He has been referred here by Dr. Bjorn Pippin from cardiology for evaluation regarding atypical chest pain that they think may be from GERD.  He tells me that he has been experiencing pressure sensation in his chest and epigastric abdominal discomfort for several months.  He saw cardiology initially back in March and they did extensive work-up that has been unremarkable.  He says that the pain is consistent, every day, but does come and go throughout the day.  He says that they prescribed Prilosec, which he took for 6 days, but he felt like it made his symptoms worse so he discontinued it.  He also describes what sounds like early satiety, saying that he feels full with lesser amounts of food than he used to.  He denies any unintentional weight loss.  No dysphagia or odynophagia.  He tells me that he gets his colonoscopies every 3 years at [facility].  Sounds like he plans to continue to go there to have those performed.  He says that he moves his bowels regularly without issues.  No evidence of dark or bloody stools.   Past Medical History:  Diagnosis Date  . Chronic kidney disease   . Diabetes mellitus without complication (HCC)   . Hyperlipidemia   . Hypertension    History reviewed. No pertinent surgical history.  reports that he quit smoking about 39 years ago. He has never used smokeless tobacco. He reports that he does not use drugs. No history on file for alcohol use. family history is not on file. Allergies  Allergen Reactions  . Antihistamines, Diphenhydramine-Type     Pt cant remember reaction.      Outpatient Encounter Medications as of 11/05/2019  Medication Sig  . amLODipine-olmesartan (AZOR) 5-40 MG tablet amlodipine 5 mg-olmesartan 40 mg tablet  1 tablet daily  . aspirin EC 81 MG tablet Take 81 mg by mouth daily.  . hydrochlorothiazide  (HYDRODIURIL) 25 MG tablet hydrochlorothiazide 25 mg tablet  1 tablet daily  . Milk Thistle 150 MG CAPS milk thistle 150 mg capsule  Take 1 capsule every day by oral route.  . Misc Natural Products (GLUCOSAMINE CHOND CMP ADVANCED PO) Take by mouth.  . Multiple Vitamin (MULTIVITAMIN PO) Take by mouth.  . [DISCONTINUED] omeprazole (PRILOSEC) 20 MG capsule Take 1 capsule (20 mg total) by mouth daily.   No facility-administered encounter medications on file as of 11/05/2019.     REVIEW OF SYSTEMS  : All other systems reviewed and negative except where noted in the History of Present Illness.   PHYSICAL EXAM: BP 120/78   Pulse 66   Ht 6' (1.829 m)   Wt 231 lb (104.8 kg)   BMI 31.33 kg/m  General: Well developed white male in no acute distress Head: Normocephalic and atraumatic Eyes:  Sclerae anicteric, conjunctiva pink. Ears: Normal auditory acuity Lungs: Clear throughout to auscultation; no increased WOB. Heart: Regular rate and rhythm; no M/R/G. Abdomen: Soft, non-distended.  BS present.  Non-tender. Musculoskeletal: Symmetrical with no gross deformities  Skin: No lesions on visible extremities Extremities: No edema  Neurological: Alert oriented x 4, grossly non-focal Psychological:  Alert and cooperative. Normal mood and affect  ASSESSMENT AND PLAN: *64 year old male with complaints of atypical chest pain and epigastric abdominal pain.  Also reports feeling full and bloated in the upper abdomen.  Saw cardiology with  negative work-up.  Took omeprazole for couple of days, but felt worse so discontinued it.  We will plan for EGD for further evaluation with Dr. Orvan Falconer, rule out esophagitis, etc.  I think it is worth trying another PPI so I am prescribing pantoprazole 40 mg daily.  I am leaving up to the patient as to whether he wants to try the medication before his procedure or wait until following his procedure and seeing results.  Prescription sent to pharmacy.  If EGD is  unremarkable is unremarkable and he has no improvement on PPI would suggest CT scan of the abdomen for further evaluation.  **The risks, benefits, and alternatives to EGD were discussed with the patient and he consents to proceed.   CC:  Little Ishikawa*

## 2019-11-05 NOTE — Progress Notes (Signed)
Reviewed and agree with management plans. ? ?Kimberly L. Beavers, MD, MPH  ?

## 2019-11-10 ENCOUNTER — Other Ambulatory Visit: Payer: Self-pay

## 2019-11-10 ENCOUNTER — Ambulatory Visit (AMBULATORY_SURGERY_CENTER): Payer: BC Managed Care – PPO | Admitting: Gastroenterology

## 2019-11-10 ENCOUNTER — Encounter: Payer: Self-pay | Admitting: Gastroenterology

## 2019-11-10 VITALS — BP 121/81 | HR 64 | Temp 97.5°F | Resp 18 | Ht 72.0 in | Wt 231.0 lb

## 2019-11-10 DIAGNOSIS — R109 Unspecified abdominal pain: Secondary | ICD-10-CM | POA: Diagnosis not present

## 2019-11-10 DIAGNOSIS — B9681 Helicobacter pylori [H. pylori] as the cause of diseases classified elsewhere: Secondary | ICD-10-CM | POA: Diagnosis not present

## 2019-11-10 DIAGNOSIS — K2970 Gastritis, unspecified, without bleeding: Secondary | ICD-10-CM

## 2019-11-10 DIAGNOSIS — K2950 Unspecified chronic gastritis without bleeding: Secondary | ICD-10-CM | POA: Diagnosis not present

## 2019-11-10 DIAGNOSIS — R0789 Other chest pain: Secondary | ICD-10-CM

## 2019-11-10 DIAGNOSIS — R1013 Epigastric pain: Secondary | ICD-10-CM

## 2019-11-10 MED ORDER — SODIUM CHLORIDE 0.9 % IV SOLN: 500.0000 mL | Freq: Once | INTRAVENOUS | Status: DC

## 2019-11-10 NOTE — Progress Notes (Signed)
pt tolerated well. VSS. awake and to recovery. Report given to RN. Bite block inserted and removed without trauma. 

## 2019-11-10 NOTE — Progress Notes (Signed)
Called to room to assist during endoscopic procedure.  Patient ID and intended procedure confirmed with present staff. Received instructions for my participation in the procedure from the performing physician.  

## 2019-11-10 NOTE — Patient Instructions (Signed)
Thank you for allowing [place] to care for you today!  Resume previous diet and medications today. Continue present medications, but increase Pantoprazole to twice per day for 12 weeks, then reduce to once daily.  Avoid Ibuprofen and naproxen, opt for Tylenol for pain and fever.   May continue with the daily baby aspirin.  Await pathology results, approximately 2 weeks.  We will call to set up a follow-up appointment with Dr Orvan Falconer.     YOU HAD AN ENDOSCOPIC PROCEDURE TODAY AT THE [facility]:   Refer to the procedure report that was given to you for any specific questions about what was found during the examination.  If the procedure report does not answer your questions, please call your gastroenterologist to clarify.  If you requested that your care partner not be given the details of your procedure findings, then the procedure report has been included in a sealed envelope for you to review at your convenience later.  YOU SHOULD EXPECT: Some feelings of bloating in the abdomen. Passage of more gas than usual.  Walking can help get rid of the air that was put into your GI tract during the procedure and reduce the bloating. If you had a lower endoscopy (such as a colonoscopy or flexible sigmoidoscopy) you may notice spotting of blood in your stool or on the toilet paper. If you underwent a bowel prep for your procedure, you may not have a normal bowel movement for a few days.  Please Note:  You might notice some irritation and congestion in your nose or some drainage.  This is from the oxygen used during your procedure.  There is no need for concern and it should clear up in a day or so.  SYMPTOMS TO REPORT IMMEDIATELY:    Following upper endoscopy (EGD)  Vomiting of blood or coffee ground material  New chest pain or pain under the shoulder blades  Painful or persistently difficult swallowing  New shortness of breath  Fever of 100F or higher  Black, tarry-looking stools  For  urgent or emergent issues, a gastroenterologist can be reached at any hour by calling [phone]. Do not use MyChart messaging for urgent concerns.    DIET:  We do recommend a small meal at first, but then you may proceed to your regular diet.  Drink plenty of fluids but you should avoid alcoholic beverages for 24 hours.  ACTIVITY:  You should plan to take it easy for the rest of today and you should NOT DRIVE or use heavy machinery until tomorrow (because of the sedation medicines used during the test).    FOLLOW UP: Our staff will call the number listed on your records 48-72 hours following your procedure to check on you and address any questions or concerns that you may have regarding the information given to you following your procedure. If we do not reach you, we will leave a message.  We will attempt to reach you two times.  During this call, we will ask if you have developed any symptoms of COVID 19. If you develop any symptoms (ie: fever, flu-like symptoms, shortness of breath, cough etc.) before then, please call [phone].  If you test positive for Covid 19 in the 2 weeks post procedure, please call and report this information to [place].    If any biopsies were taken you will be contacted by phone or by letter within the next 1-3 weeks.  Please call us at [phone] if you have not  heard about the biopsies in 3 weeks.    SIGNATURES/CONFIDENTIALITY: You and/or your care partner have signed paperwork which will be entered into your electronic medical record.  These signatures attest to the fact that that the information above on your After Visit Summary has been reviewed and is understood.  Full responsibility of the confidentiality of this discharge information lies with you and/or your care-partner.

## 2019-11-10 NOTE — Op Note (Signed)
[facility] Patient Name: Roxan HockeyJohn Freilich Procedure Date: 11/10/2019 9:34 AM MRN: [id number] Endoscopist: Tressia DanasKimberly Biana Haggar MD, MD Age: 64 Referring MD:  Date of Birth: 05/28/1955 Gender: Male Account #: [id number] Procedure:                Upper GI endoscopy Indications:              Epigastric abdominal pain, Chest pain (non cardiac) Medicines:                Monitored Anesthesia Care Procedure:                Pre-Anesthesia Assessment:                           - Prior to the procedure, a History and Physical                            was performed, and patient medications and                            allergies were reviewed. The patient's tolerance of                            previous anesthesia was also reviewed. The risks                            and benefits of the procedure and the sedation                            options and risks were discussed with the patient.                            All questions were answered, and informed consent                            was obtained. Prior Anticoagulants: The patient has                            taken no previous anticoagulant or antiplatelet                            agents. ASA Grade Assessment: III - A patient with                            severe systemic disease. After reviewing the risks                            and benefits, the patient was deemed in                            satisfactory condition to undergo the procedure.                           After obtaining informed consent, the endoscope was  passed under direct vision. Throughout the                            procedure, the patient's blood pressure, pulse, and                            oxygen saturations were monitored continuously. The                            Endoscope was introduced through the mouth, and                            advanced to the third part of duodenum. The upper                            GI  endoscopy was accomplished without difficulty.                            The patient tolerated the procedure well. Scope In: Scope Out: Findings:                 The examined esophagus was normal. Biopsies were                            obtained from the proximal and distal esophagus                            with cold forceps for histology.                           Diffuse moderate inflammation characterized by                            erythema, friability and granularity was found in                            the gastric fundus. Biopsies were taken from the                            antrum, body, and fundus with a cold forceps for                            histology. Estimated blood loss was minimal.                           The examined duodenum was normal.                           The cardia and gastric fundus were normal on                            retroflexion.                           The exam was otherwise without abnormality. Complications:  No immediate complications. Estimated blood loss:                            Minimal. Estimated Blood Loss:     Estimated blood loss was minimal. Impression:               - Normal esophagus. Biopsied.                           - Gastritis. Biopsied.                           - Normal examined duodenum.                           - The examination was otherwise normal. Recommendation:           - Patient has a contact number available for                            emergencies. The signs and symptoms of potential                            delayed complications were discussed with the                            patient. Return to normal activities tomorrow.                            Written discharge instructions were provided to the                            patient.                           - Resume previous diet.                           - Continue present medications. Increase                             pantoprazole to 40 mg BID for at least 12 weeks,                            then reduce to 40 mg daily.                           - No aspirin, ibuprofen, naproxen, or other                            non-steroidal anti-inflammatory drugs.                           - Await pathology results.                           - Follow-up in the office with Dr. Orvan Falconer to  review these results. Tressia Danas MD, MD 11/10/2019 9:57:22 AM This report has been signed electronically.

## 2019-11-10 NOTE — Progress Notes (Signed)
Pt's states no medical or surgical changes since previsit or office visit.  CW - vitals 

## 2019-11-11 ENCOUNTER — Telehealth: Payer: Self-pay | Admitting: *Deleted

## 2019-11-11 NOTE — Telephone Encounter (Signed)
Please schedule a follow-up visit with me or Shanda Bumps - the next available when the schedule opens up. Thank you.

## 2019-11-11 NOTE — Telephone Encounter (Signed)
Appointment letter mailed to patient for 11/25/19 11:30 Am with Rito Ehrlich, PA

## 2019-11-11 NOTE — Telephone Encounter (Signed)
Per procedure report, f/u OV to be scheduled with Dr. Orvan Falconer to review results of endo. No OV available at this time. Please, advise.

## 2019-11-12 ENCOUNTER — Telehealth: Payer: Self-pay

## 2019-11-12 NOTE — Telephone Encounter (Signed)
  Follow up Call-  Call back number 11/10/2019  Post procedure Call Back phone  # [phone]  Permission to leave phone message Yes  Some recent data might be hidden     Patient questions:  Do you have a fever, pain , or abdominal swelling? No. Pain Score  3 *-- Pt states some soreness in throat that is getting better. Instructed patient that the pain is expected and that if it gets worse or does not subside in a few days to give [place] a call back.   Have you tolerated food without any problems? Yes.    Have you been able to return to your normal activities? Yes.    Do you have any questions about your discharge instructions: Diet   No. Medications  No. Follow up visit  No.  Do you have questions or concerns about your Care? No.  Actions: * If pain score is 4 or above: No action needed, pain <4.   1. Have you developed a fever since your procedure? No   2.   Have you had an respiratory symptoms (SOB or cough) since your procedure? No   3.   Have you tested positive for COVID 19 since your procedure? No   4.   Have you had any family members/close contacts diagnosed with the COVID 19 since your procedure?  No   If yes to any of these questions please route to Laverna Peace, RN and Charlett Lango, RN

## 2019-11-12 NOTE — Telephone Encounter (Signed)
Attempted to reach pt. With follow-up call following endoscopic procedure 11/10/2019.  LM On pt. Voice mail.  Will try to reach pt. Again later today.

## 2019-11-17 NOTE — Telephone Encounter (Signed)
Patient returned the call is doing well no questions at this time 

## 2019-11-24 ENCOUNTER — Telehealth: Payer: Self-pay | Admitting: Gastroenterology

## 2019-11-24 MED ORDER — PANTOPRAZOLE SODIUM 40 MG PO TBEC: 40.0000 mg | DELAYED_RELEASE_TABLET | Freq: Every day | ORAL | 5 refills | Status: DC

## 2019-11-24 NOTE — Telephone Encounter (Signed)
Refills have been sent to patient's pharmacy. °

## 2019-11-25 ENCOUNTER — Ambulatory Visit: Payer: BC Managed Care – PPO | Admitting: Gastroenterology

## 2019-11-26 ENCOUNTER — Ambulatory Visit (INDEPENDENT_AMBULATORY_CARE_PROVIDER_SITE_OTHER): Payer: BC Managed Care – PPO | Admitting: Gastroenterology

## 2019-11-26 ENCOUNTER — Encounter: Payer: Self-pay | Admitting: Gastroenterology

## 2019-11-26 ENCOUNTER — Telehealth: Payer: Self-pay | Admitting: Gastroenterology

## 2019-11-26 VITALS — BP 130/80 | HR 75 | Ht 72.0 in | Wt 230.0 lb

## 2019-11-26 DIAGNOSIS — B9681 Helicobacter pylori [H. pylori] as the cause of diseases classified elsewhere: Secondary | ICD-10-CM

## 2019-11-26 DIAGNOSIS — K2970 Gastritis, unspecified, without bleeding: Secondary | ICD-10-CM

## 2019-11-26 MED ORDER — METRONIDAZOLE 500 MG PO TABS: 500.0000 mg | ORAL_TABLET | Freq: Four times a day (QID) | ORAL | 0 refills | Status: AC

## 2019-11-26 MED ORDER — TETRACYCLINE HCL 500 MG PO CAPS: 500.0000 mg | ORAL_CAPSULE | Freq: Four times a day (QID) | ORAL | 0 refills | Status: AC

## 2019-11-26 MED ORDER — PANTOPRAZOLE SODIUM 40 MG PO TBEC: 40.0000 mg | DELAYED_RELEASE_TABLET | Freq: Two times a day (BID) | ORAL | 4 refills | Status: AC

## 2019-11-26 NOTE — Progress Notes (Addendum)
11/26/2019 Bengie Kaucher [id number] 10-09-1955   HISTORY OF PRESENT ILLNESS: This is a 64 year old male who is a patient of Dr. Orvan Falconer.  He was initially seen by me in July for complaints of atypical chest pain and epigastric abdominal pain.  Thought to be noncardiac in origin.  Ultimately he underwent EGD with Dr. Orvan Falconer on July 26 and was found to have only gastritis.  Biopsies were positive for H. pylori.  We attempted to contact him with prescription regimen, but were not able to get in touch with him.  Currently he is only on pantoprazole 40 mg twice daily since his procedure and says that he actually has been feeling somewhat better.   Past Medical History:  Diagnosis Date  . Chronic kidney disease   . Diabetes mellitus without complication (HCC)   . Hyperlipidemia   . Hypertension    Past Surgical History:  Procedure Laterality Date  . ESOPHAGOGASTRODUODENOSCOPY      reports that he quit smoking about 39 years ago. His smoking use included cigarettes. He has never used smokeless tobacco. He reports current alcohol use of about 1.0 standard drink of alcohol per week. He reports that he does not use drugs. family history includes Heart disease in his father; Kidney disease in his mother. Allergies  Allergen Reactions  . Antihistamines, Diphenhydramine-Type     Pt cant remember reaction.      Outpatient Encounter Medications as of 11/26/2019  Medication Sig  . amLODipine-olmesartan (AZOR) 5-40 MG tablet amlodipine 5 mg-olmesartan 40 mg tablet  1 tablet daily  . aspirin EC 81 MG tablet Take 81 mg by mouth daily.  . hydrochlorothiazide (HYDRODIURIL) 25 MG tablet hydrochlorothiazide 25 mg tablet  1 tablet daily  . Milk Thistle 150 MG CAPS milk thistle 150 mg capsule  Take 1 capsule every day by oral route.  . Misc Natural Products (GLUCOSAMINE CHOND CMP ADVANCED PO) Take by mouth.  . Multiple Vitamin (MULTIVITAMIN PO) Take by mouth.  . pantoprazole (PROTONIX) 40 MG  tablet Take 40 mg by mouth 2 (two) times daily before a meal.  . [DISCONTINUED] pantoprazole (PROTONIX) 40 MG tablet Take 1 tablet (40 mg total) by mouth daily. (Patient taking differently: Take 40 mg by mouth 2 (two) times daily. )   No facility-administered encounter medications on file as of 11/26/2019.     REVIEW OF SYSTEMS  : All other systems reviewed and negative except where noted in the History of Present Illness.   PHYSICAL EXAM: BP 130/80   Pulse 75   Ht 6' (1.829 m)   Wt 230 lb (104.3 kg)   BMI 31.19 kg/m  General: Well developed AA male in no acute distress Head: Normocephalic and atraumatic Eyes:  Sclerae anicteric, conjunctiva pink. Ears: Normal auditory acuity Lungs: Clear throughout to auscultation; no increased WOB. Heart: Regular rate and rhythm; no M/R/G. Abdomen: Soft, non-distended.  BS present.  Non-tender. Musculoskeletal: Symmetrical with no gross deformities  Skin: No lesions on visible extremities Extremities: No edema  Neurological: Alert oriented x 4, grossly non-focal Psychological:  Alert and cooperative. Normal mood and affect  ASSESSMENT AND PLAN: *Hpylori gastritis:  Will treat with regimen recommended by Dr. Orvan Falconer, bismuth 300 mg four times daily, metronidazole 500 mg four times daily, tetracycline 500 mg four times daily, and pantoprazole 40 mg BID, all for 14 days.  No ETOH while on medication or for 3 days following treatment.  Will then continue pantoprazole 40 mg BID.  Will check stool  Ag for Hpylori 8 weeks post-treatment (needs to stop PPI for 2 weeks prior to collecting stool sample then will resume after stool sample is collected).  **Addendum:  Colonoscopy records received from Dr. Bosie Clos at [facility].  Colonoscopy September 2018 showed three 4 to 10 mm polyps in the descending colon removed with hot snare.  He also had diverticulosis in the sigmoid colon, descending colon, and ascending colon.  Internal hemorrhoids.  Polyps were all  tubular adenomas.  Recall recommended at 3-year interval.   CC:  Mila Palmer, MD

## 2019-11-26 NOTE — Patient Instructions (Signed)
STOP ALL ALCOHOL USE FOR 17 DAYS    STARTING 11/27/19    H. Pylori Treatment breakdown is as follows (all x 14 days, then discontinue):  Breakfast:  *Bismuth subsalicylate chewable tablet (Pepto Bismol) 262 mg--    2 tablets  *Tetracycline 500 mg tablet - 1 tablet  *Metronidazole (Flagyl) 500 mg tablet-1 tablet  *Pantoprazole (Protonix) 40 mg-1 tablet  Lunch:  *Bismuth subsalicylate chewable tablet (Pepto Bismol) 262 mg--    2 tablets  *Tetracycline 500 mg tablet - 1 tablet   Dinner: *Bismuth subsalicylate chewable tablet (Pepto Bismol) 262 mg--    2 tablets  *Tetracycline 500 mg tablet - 1 tablet  *Metronidazole (Flagyl) 500 mg tablet-1 tablet  *Pantoprazole (Protonix) 40 mg-1 tablet  Bedtime: *Bismuth subsalicylate chewable tablet (Pepto Bismol) 262 mg--   2 tablets  *Tetracycline 500 mg tablet - 1 tablet  *Metronidazole (Flagyl) 500 mg tablet-1 tablet  Around 02/05/20 or later - Eight weeks after completing the treatment please complete H pylori stool antigen should be performed to monitor for response to treatment. Will need to stop Pantoprazole 2 weeks prior to collecting sample.

## 2019-11-26 NOTE — Telephone Encounter (Signed)
Patient called and states Tetracylcine will cost $250. Please advise on next option. Dr. Orvan Falconer is out of the office.

## 2019-11-26 NOTE — Telephone Encounter (Signed)
Patient called in reference to the cost of Tetracycline

## 2019-11-28 NOTE — Telephone Encounter (Signed)
Left message for patient to call the office

## 2019-11-28 NOTE — Telephone Encounter (Signed)
Please let him know that I would prefer to get Dr. Orvan Falconer input as to what her next recommended regimen would be to treat him.  We will be back in touch with him once we hear from her.  Please forward to Dr. Orvan Falconer as well.

## 2019-12-01 NOTE — Telephone Encounter (Signed)
Left messsage informing her will call tomorrow with recommendations from Dr. Orvan Falconer.   Dr. Orvan Falconer patient came into the clinic last week to see [place], [place]. Patient was informed of positive H. Pylori and medication was sent in to start treatment. Patient called back to say the Tetracycline was to expensive and he is unable to afford it. Please advise on your next option. Read telephone contacts below.

## 2019-12-01 NOTE — Telephone Encounter (Signed)
What is your next preferred antibiotic and I will see if its covered by his insurance.

## 2019-12-01 NOTE — Telephone Encounter (Signed)
Please find out what h pylori treatment option is approved by his insurance company. Thank you.

## 2019-12-02 NOTE — Telephone Encounter (Signed)
Clarithromycin 500 mg twice daily for 14 days, amoxicillin 1 g twice daily for 14 days, and PPI twice daily for 14 days. Eight weeks after completing the treatment, an H pylori stool antigen should be performed to monitor for response to treatment.  Return to the office after completing treatment. Thank you.

## 2019-12-02 NOTE — Telephone Encounter (Signed)
Spoke with patient and he started the Tetracycline on Friday. He went ahead and paid for it.

## 2019-12-07 DIAGNOSIS — Z20822 Contact with and (suspected) exposure to covid-19: Secondary | ICD-10-CM | POA: Diagnosis not present

## 2019-12-07 DIAGNOSIS — Z03818 Encounter for observation for suspected exposure to other biological agents ruled out: Secondary | ICD-10-CM | POA: Diagnosis not present

## 2019-12-08 NOTE — Progress Notes (Signed)
Reviewed and agree with management plans. ? ?Matilyn Fehrman L. Peretz Thieme, MD, MPH  ?

## 2020-01-15 ENCOUNTER — Telehealth: Payer: Self-pay

## 2020-01-15 NOTE — Telephone Encounter (Signed)
The pt has been advised and will call back to make an appt for colon with Dr Orvan Falconer.

## 2020-01-15 NOTE — Telephone Encounter (Signed)
-----   Message from Leta Baptist, PA-C sent at 01/15/2020  4:16 PM EDT ----- We let him know that I received his records from [facility] regarding his colonoscopy.  It was performed in September 2018.  Due to the number and size of polyps that were removed they recommended a 3-year recall.  That means that he would be due currently.  We please schedule him with Dr. Orvan Falconer.   Thank you,  Jess

## 2020-02-05 ENCOUNTER — Other Ambulatory Visit: Payer: BC Managed Care – PPO

## 2020-02-05 DIAGNOSIS — K2970 Gastritis, unspecified, without bleeding: Secondary | ICD-10-CM | POA: Diagnosis not present

## 2020-02-05 DIAGNOSIS — B9681 Helicobacter pylori [H. pylori] as the cause of diseases classified elsewhere: Secondary | ICD-10-CM

## 2020-02-09 LAB — H. PYLORI ANTIGEN, STOOL: H pylori Ag, Stl: NEGATIVE

## 2020-02-24 DIAGNOSIS — E1122 Type 2 diabetes mellitus with diabetic chronic kidney disease: Secondary | ICD-10-CM | POA: Diagnosis not present

## 2020-02-24 DIAGNOSIS — E782 Mixed hyperlipidemia: Secondary | ICD-10-CM | POA: Diagnosis not present

## 2020-02-24 DIAGNOSIS — Z0000 Encounter for general adult medical examination without abnormal findings: Secondary | ICD-10-CM | POA: Diagnosis not present

## 2020-02-24 DIAGNOSIS — I10 Essential (primary) hypertension: Secondary | ICD-10-CM | POA: Diagnosis not present

## 2020-02-24 DIAGNOSIS — Z79899 Other long term (current) drug therapy: Secondary | ICD-10-CM | POA: Diagnosis not present

## 2020-02-24 DIAGNOSIS — Z125 Encounter for screening for malignant neoplasm of prostate: Secondary | ICD-10-CM | POA: Diagnosis not present

## 2020-04-11 NOTE — Progress Notes (Signed)
Cardiology Office Note:    Date:  04/12/2020   ID:  Jay Reynolds, DOB 07/16/1955, MRN [id number]  PCP:  Mila Palmer, MD  Cardiologist:  No primary care provider on file.  Electrophysiologist:  None   Referring MD: Mila Palmer, MD   Chief Complaint  Patient presents with  . Chest Pain    History of Present Illness:    Jay Reynolds is a 64 y.o. male with a hx of type 2 diabetes, CKD stage II, hyperlipidemia, hypertension who presents for follow-up.  He was referred by Dr. Paulino Rily for evaluation of palpitations and chest pain, initially seen on 02/20/2019.  Reports that he has been getting a sensation in his chest that he describes as fluttering.  Has been occurring for past 1 year.  Occurs every day.  States that it feels like his chest is fluttering.  Occurs at rest.  However, he states that he recently had different type of chest pain that occurred with exertion.  Describes that he had burning in his chest that occurred when he went for a walk.  He walks for 3 miles every day.  Denies any shortness of breath.  He has diet-controlled diabetes.  He reports that his blood pressure has been controlled on his current regimen but can be as high as 140s over 90s when he checks at home.  He quit smoking in 1982, smoked 0.5 packs/day x 7 years.  In regards to family history, he reports that his father had CHF and his brother had a CABG in his 62s.  Lexiscan Myoview on 03/11/2019 showed EF 47%, fixed anteroseptal perfusion defect (suspected to be artifact given normal wall motion in this area).  TTE 02/27/2019 showed EF 60 to 65%, no significant valvular disease.  Zio patch x3 days on 04/04/2019 showed no significant arrhythmias, occasional PVCs (1.1% of beats).  Since last clinic visit, he reports that he has been doing well.  States that his chest pain has improved significantly since his gastritis/H. pylori was treated.  States that he still feels chest pain on occasion, particularly  when he drinks beer.  Reports that he is walking 3 miles 5 days/week and denies any exertional chest pain or dyspnea.  Denies any lightheadedness, syncope, palpitations, or lower extremity edema.  Reports BP has been controlled he checks at home.  He did not start taking his statin.    Past Medical History:  Diagnosis Date  . Chronic kidney disease   . Diabetes mellitus without complication (HCC)   . Hyperlipidemia   . Hypertension     Past Surgical History:  Procedure Laterality Date  . ESOPHAGOGASTRODUODENOSCOPY      Current Medications: Current Meds  Medication Sig  . amLODipine-olmesartan (AZOR) 5-40 MG tablet amlodipine 5 mg-olmesartan 40 mg tablet  1 tablet daily  . hydrochlorothiazide (HYDRODIURIL) 25 MG tablet hydrochlorothiazide 25 mg tablet  1 tablet daily  . Milk Thistle 150 MG CAPS milk thistle 150 mg capsule  Take 1 capsule every day by oral route.  . Misc Natural Products (GLUCOSAMINE CHOND DOUBLE STR) TABS 1 tablet  . Multiple Vitamin (MULTIVITAMIN PO) Take by mouth.  . pantoprazole (PROTONIX) 40 MG tablet Take 1 tablet (40 mg total) by mouth 2 (two) times daily before a meal.  . [DISCONTINUED] aspirin EC 81 MG tablet Take 81 mg by mouth daily.     Allergies:   Antihistamines, diphenhydramine-type   Social History   Socioeconomic History  . Marital status: Married  Spouse name: Not on file  . Number of children: 2  . Years of education: Not on file  . Highest education level: Not on file  Occupational History  . Occupation: self employed  Tobacco Use  . Smoking status: Former Smoker    Types: Cigarettes    Quit date: 1982    Years since quitting: 40.0  . Smokeless tobacco: Never Used  Vaping Use  . Vaping Use: Never used  Substance and Sexual Activity  . Alcohol use: Yes    Alcohol/week: 1.0 standard drink    Types: 1 Cans of beer per week    Comment: occ  . Drug use: Never  . Sexual activity: Not on file  Other Topics Concern  . Not on  file  Social History Narrative  . Not on file   Social Determinants of Health   Financial Resource Strain: Not on file  Food Insecurity: Not on file  Transportation Needs: Not on file  Physical Activity: Not on file  Stress: Not on file  Social Connections: Not on file     Family History: Father had CHF and his brother had a CABG in his 41s.  ROS:   Please see the history of present illness.    All other systems reviewed and are negative.  EKGs/Labs/Other Studies Reviewed:    The following studies were reviewed today:  EKG:  EKG is not ordered today.  The ekg ordered at prior clinic visit demonstrates NSR, rate 70, no ST/T abnormalities  Cardiac monitor 04/04/2019:  No significant arrhythmias detected  Occasional PVCs (1.1% of beats), with episode of bigeminy lasting 6.5 seconds  No patient triggered events   3 days of data recorded on Zio monitor. Patient had a min HR of 58 bpm, max HR of 140 bpm, and avg HR of 90 bpm. Predominant underlying rhythm was Sinus Rhythm. No VT, SVT, atrial fibrillation, high degree block, or pauses noted. Isolated atrial ectopy was rare (<1%).  Ventricular ectopy was occasional (1.1%).  Ventricular bigemny was present, longest lasting 6.5 seconds.   There were 0 triggered events. No significant arrhythmias detected.  Lexiscan Myoview 03/11/2019:  There was no ST segment deviation noted during stress.  Nuclear stress EF: 47%.  The left ventricular ejection fraction is mildly decreased (45-54%).  Defect 1: There is a medium fixed defect of mild severity present in the basal anteroseptal, mid anteroseptal, apical septal and apex location.  Findings consistent with prior myocardial infarction versus artifact. Normal wall motion suggests more likely artifact  This is an intermediate risk study given decreased systolic function. No ischemia.   Fixed anteroseptal perfusion defect.  Normal wall motion in this area suggests artifact.  Mild  systolic dysfunction, but normal function on recent TTE   TTE 02/27/19: 1. The average left ventricular global longitudinal strain is normal at  -18.6 %.  2. Left ventricular ejection fraction, by visual estimation, is 60 to  65%. The left ventricle has normal function. There is no left ventricular  hypertrophy.  3. Left ventricular diastolic parameters are indeterminate.  4. Global right ventricle has normal systolic function.The right  ventricular size is normal. No increase in right ventricular wall  thickness.  5. Left atrial size was normal.  6. Right atrial size was normal.  7. The mitral valve is normal in structure. Trace mitral valve  regurgitation. No evidence of mitral stenosis.  8. The tricuspid valve is normal in structure. Tricuspid valve  regurgitation is not demonstrated.  9. The aortic  valve is normal in structure. Aortic valve regurgitation is  not visualized. No evidence of aortic valve sclerosis or stenosis.  10. The pulmonic valve was normal in structure. Pulmonic valve  regurgitation is not visualized.  11. The inferior vena cava is normal in size with greater than 50%  respiratory variability, suggesting right atrial pressure of 3 mmHg.   Recent Labs: No results found for requested labs within last 8760 hours.  Recent Lipid Panel No results found for: CHOL, TRIG, HDL, CHOLHDL, VLDL, LDLCALC, LDLDIRECT  Physical Exam:    VS:  BP (!) 124/92 (BP Location: Left Arm, Patient Position: Sitting)   Pulse 73   Ht 6' (1.829 m)   Wt 231 lb (104.8 kg)   SpO2 94%   BMI 31.33 kg/m     Wt Readings from Last 3 Encounters:  04/12/20 231 lb (104.8 kg)  11/26/19 230 lb (104.3 kg)  11/10/19 (!) 231 lb (104.8 kg)     GEN: Well nourished, well developed in no acute distress HEENT: Normal NECK: No JVD CARDIAC: RRR, no murmurs, rubs, gallops RESPIRATORY:  Clear to auscultation without rales, wheezing or rhonchi  ABDOMEN: Soft, non-tender,  non-distended MUSCULOSKELETAL:  No edema; No deformity  SKIN: Warm and dry NEUROLOGIC:  Alert and oriented x 3 PSYCHIATRIC:  Normal affect   ASSESSMENT:    1. Hyperlipidemia, unspecified hyperlipidemia type   2. Precordial pain   3. Essential hypertension   4. Palpitations    PLAN:    Chest pain: Atypical in description.  No ischemia on Lexiscan Myoview.  TTE shows no significant abnormalities.  Concern for GI etiology, referred to GI for evaluation.  EGD in July 2021 showed gastritis, biopsies positive for H. Pylori.  Reports chest pain has significantly improved since gastritis/H. pylori was treated. - Stopped aspirin  Palpitations: Describes a fluttering feeling in chest.  Zio patch showed no significant arrhythmias, occasional PVCs (1.1% of beats).  Reports palpitations have improved.  Hypertension: On amlodipine, olmesartan 5-40 mg daily, hydrochlorothiazide 25 mg daily.  DBP mildly elevated in clinic today, asked to check BP twice daily for next week and call with results  T2DM: A1c 6.6.  Diet controlled  Hyperlipidemia: LDL 121 on 02/24/2020.  Given his history of diabetes, recommend starting statin.  Previously had prescribed statin but he did not start taking.  He is now agreeable to starting statin, will prescribe rosuvastatin 10 mg daily.  Will check LFTs as does report a history of fatty liver disease.  RTC in 6 months  Medication Adjustments/Labs and Tests Ordered: Current medicines are reviewed at length with the patient today.  Concerns regarding medicines are outlined above.  Orders Placed This Encounter  Procedures  . Comprehensive metabolic panel   Meds ordered this encounter  Medications  . rosuvastatin (CRESTOR) 10 MG tablet    Sig: Take 1 tablet (10 mg total) by mouth daily.    Dispense:  90 tablet    Refill:  3    Patient Instructions  Medication Instructions:  STOP aspirin START rosuvastatin (Crestor) 10 mg daily  *If you need a refill on your  cardiac medications before your next appointment, please call your pharmacy*   Lab Work: CMET today  Lipid panel in 2-3 months-I will mail you the lab slips as a reminder.  If you have labs (blood work) drawn today and your tests are completely normal, you will receive your results only by: Marland Kitchen MyChart Message (if you have MyChart) OR . A paper copy in  the mail If you have any lab test that is abnormal or we need to change your treatment, we will call you to review the results.  Follow-Up: At [facility], you and your health needs are our priority.  As part of our continuing mission to provide you with exceptional heart care, we have created designated Provider Care Teams.  These Care Teams include your primary Cardiologist (physician) and Advanced Practice Providers (APPs -  Physician Assistants and Nurse Practitioners) who all work together to provide you with the care you need, when you need it.  We recommend signing up for the patient portal called "MyChart".  Sign up information is provided on this After Visit Summary.  MyChart is used to connect with patients for Virtual Visits (Telemedicine).  Patients are able to view lab/test results, encounter notes, upcoming appointments, etc.  Non-urgent messages can be sent to your provider as well.   To learn more about what you can do with MyChart, go to [email or web address].    Your next appointment:   6 month(s)  The format for your next appointment:   In Person  Provider:   Epifanio Lescheshristopher Callaway Hardigree, MD   Other Instructions Please check your blood pressure at home daily, write it down.  Call the office or send message via Mychart with the readings in 1 week for Dr. Bjorn PippinSchumann to review.       Signed, Little Ishikawahristopher L Anushka Hartinger, MD  04/12/2020 9:45 AM    [facility]

## 2020-04-12 ENCOUNTER — Other Ambulatory Visit: Payer: Self-pay

## 2020-04-12 ENCOUNTER — Ambulatory Visit (INDEPENDENT_AMBULATORY_CARE_PROVIDER_SITE_OTHER): Payer: BC Managed Care – PPO | Admitting: Cardiology

## 2020-04-12 ENCOUNTER — Encounter: Payer: Self-pay | Admitting: Cardiology

## 2020-04-12 VITALS — BP 124/92 | HR 73 | Ht 72.0 in | Wt 231.0 lb

## 2020-04-12 DIAGNOSIS — R002 Palpitations: Secondary | ICD-10-CM | POA: Diagnosis not present

## 2020-04-12 DIAGNOSIS — E785 Hyperlipidemia, unspecified: Secondary | ICD-10-CM

## 2020-04-12 DIAGNOSIS — I10 Essential (primary) hypertension: Secondary | ICD-10-CM

## 2020-04-12 DIAGNOSIS — R072 Precordial pain: Secondary | ICD-10-CM | POA: Diagnosis not present

## 2020-04-12 LAB — COMPREHENSIVE METABOLIC PANEL
ALT: 67 IU/L — ABNORMAL HIGH (ref 0–44)
AST: 41 IU/L — ABNORMAL HIGH (ref 0–40)
Albumin/Globulin Ratio: 1.6 (ref 1.2–2.2)
Albumin: 4.6 g/dL (ref 3.8–4.8)
Alkaline Phosphatase: 79 IU/L (ref 44–121)
BUN/Creatinine Ratio: 11 (ref 10–24)
BUN: 13 mg/dL (ref 8–27)
Bilirubin Total: 0.4 mg/dL (ref 0.0–1.2)
CO2: 27 mmol/L (ref 20–29)
Calcium: 10.3 mg/dL — ABNORMAL HIGH (ref 8.6–10.2)
Chloride: 95 mmol/L — ABNORMAL LOW (ref 96–106)
Creatinine, Ser: 1.14 mg/dL (ref 0.76–1.27)
GFR calc Af Amer: 78 mL/min/{1.73_m2} (ref >59)
GFR calc non Af Amer: 68 mL/min/{1.73_m2} (ref >59)
Globulin, Total: 2.8 g/dL (ref 1.5–4.5)
Glucose: 115 mg/dL — ABNORMAL HIGH (ref 65–99)
Potassium: 4.2 mmol/L (ref 3.5–5.2)
Sodium: 136 mmol/L (ref 134–144)
Total Protein: 7.4 g/dL (ref 6.0–8.5)

## 2020-04-12 MED ORDER — ROSUVASTATIN CALCIUM 10 MG PO TABS: 10.0000 mg | ORAL_TABLET | Freq: Every day | ORAL | 3 refills | Status: DC

## 2020-04-12 NOTE — Patient Instructions (Signed)
Medication Instructions:  STOP aspirin START rosuvastatin (Crestor) 10 mg daily  *If you need a refill on your cardiac medications before your next appointment, please call your pharmacy*   Lab Work: CMET today  Lipid panel in 2-3 months-I will mail you the lab slips as a reminder.  If you have labs (blood work) drawn today and your tests are completely normal, you will receive your results only by: Marland Kitchen MyChart Message (if you have MyChart) OR . A paper copy in the mail If you have any lab test that is abnormal or we need to change your treatment, we will call you to review the results.  Follow-Up: At [facility], you and your health needs are our priority.  As part of our continuing mission to provide you with exceptional heart care, we have created designated Provider Care Teams.  These Care Teams include your primary Cardiologist (physician) and Advanced Practice Providers (APPs -  Physician Assistants and Nurse Practitioners) who all work together to provide you with the care you need, when you need it.  We recommend signing up for the patient portal called "MyChart".  Sign up information is provided on this After Visit Summary.  MyChart is used to connect with patients for Virtual Visits (Telemedicine).  Patients are able to view lab/test results, encounter notes, upcoming appointments, etc.  Non-urgent messages can be sent to your provider as well.   To learn more about what you can do with MyChart, go to [email or web address].    Your next appointment:   6 month(s)  The format for your next appointment:   In Person  Provider:   Epifanio Lesches, MD   Other Instructions Please check your blood pressure at home daily, write it down.  Call the office or send message via Mychart with the readings in 1 week for Dr. Bjorn Pippin to review.

## 2020-04-13 ENCOUNTER — Other Ambulatory Visit: Payer: Self-pay | Admitting: *Deleted

## 2020-04-13 DIAGNOSIS — R7989 Other specified abnormal findings of blood chemistry: Secondary | ICD-10-CM

## 2020-04-13 DIAGNOSIS — Z79899 Other long term (current) drug therapy: Secondary | ICD-10-CM

## 2020-04-29 NOTE — Telephone Encounter (Signed)
BP looks good, no changes 

## 2020-05-07 DIAGNOSIS — K219 Gastro-esophageal reflux disease without esophagitis: Secondary | ICD-10-CM | POA: Diagnosis not present

## 2020-05-07 DIAGNOSIS — Z86010 Personal history of colonic polyps: Secondary | ICD-10-CM | POA: Diagnosis not present

## 2020-05-07 DIAGNOSIS — A048 Other specified bacterial intestinal infections: Secondary | ICD-10-CM | POA: Diagnosis not present

## 2020-05-17 ENCOUNTER — Encounter: Payer: Self-pay | Admitting: *Deleted

## 2020-05-18 DIAGNOSIS — R7989 Other specified abnormal findings of blood chemistry: Secondary | ICD-10-CM | POA: Diagnosis not present

## 2020-05-18 DIAGNOSIS — Z79899 Other long term (current) drug therapy: Secondary | ICD-10-CM | POA: Diagnosis not present

## 2020-05-18 LAB — HEPATIC FUNCTION PANEL
ALT: 52 IU/L — ABNORMAL HIGH (ref 0–44)
AST: 36 IU/L (ref 0–40)
Albumin: 4.8 g/dL (ref 3.8–4.8)
Alkaline Phosphatase: 73 IU/L (ref 44–121)
Bilirubin Total: 0.4 mg/dL (ref 0.0–1.2)
Bilirubin, Direct: 0.15 mg/dL (ref 0.00–0.40)
Total Protein: 7.5 g/dL (ref 6.0–8.5)

## 2020-06-28 DIAGNOSIS — Z01812 Encounter for preprocedural laboratory examination: Secondary | ICD-10-CM | POA: Diagnosis not present

## 2020-07-01 DIAGNOSIS — K640 First degree hemorrhoids: Secondary | ICD-10-CM | POA: Diagnosis not present

## 2020-07-01 DIAGNOSIS — K5730 Diverticulosis of large intestine without perforation or abscess without bleeding: Secondary | ICD-10-CM | POA: Diagnosis not present

## 2020-07-01 DIAGNOSIS — D123 Benign neoplasm of transverse colon: Secondary | ICD-10-CM | POA: Diagnosis not present

## 2020-07-01 DIAGNOSIS — D124 Benign neoplasm of descending colon: Secondary | ICD-10-CM | POA: Diagnosis not present

## 2020-07-01 DIAGNOSIS — Z86010 Personal history of colonic polyps: Secondary | ICD-10-CM | POA: Diagnosis not present

## 2021-02-15 ENCOUNTER — Other Ambulatory Visit: Payer: Self-pay | Admitting: Gastroenterology

## 2021-02-15 ENCOUNTER — Other Ambulatory Visit: Payer: Self-pay | Admitting: Cardiology

## 2021-03-14 DIAGNOSIS — I10 Essential (primary) hypertension: Secondary | ICD-10-CM | POA: Diagnosis not present

## 2021-03-14 DIAGNOSIS — N182 Chronic kidney disease, stage 2 (mild): Secondary | ICD-10-CM | POA: Diagnosis not present

## 2021-03-14 DIAGNOSIS — Z0000 Encounter for general adult medical examination without abnormal findings: Secondary | ICD-10-CM | POA: Diagnosis not present

## 2021-03-14 DIAGNOSIS — K760 Fatty (change of) liver, not elsewhere classified: Secondary | ICD-10-CM | POA: Diagnosis not present

## 2021-03-14 DIAGNOSIS — Z79899 Other long term (current) drug therapy: Secondary | ICD-10-CM | POA: Diagnosis not present

## 2021-03-14 DIAGNOSIS — Z125 Encounter for screening for malignant neoplasm of prostate: Secondary | ICD-10-CM | POA: Diagnosis not present

## 2021-03-14 DIAGNOSIS — E1122 Type 2 diabetes mellitus with diabetic chronic kidney disease: Secondary | ICD-10-CM | POA: Diagnosis not present

## 2021-03-30 ENCOUNTER — Other Ambulatory Visit: Payer: Self-pay | Admitting: Family Medicine

## 2021-03-30 DIAGNOSIS — Z136 Encounter for screening for cardiovascular disorders: Secondary | ICD-10-CM

## 2021-04-20 ENCOUNTER — Ambulatory Visit
Admission: RE | Admit: 2021-04-20 | Discharge: 2021-04-20 | Disposition: A | Discharge status: Home / Self Care | Payer: Medicare Other | Source: Ambulatory Visit | Attending: Family Medicine | Admitting: Family Medicine

## 2021-04-20 DIAGNOSIS — Z136 Encounter for screening for cardiovascular disorders: Secondary | ICD-10-CM

## 2021-05-18 ENCOUNTER — Other Ambulatory Visit: Payer: Self-pay | Admitting: Cardiology

## 2022-03-20 DIAGNOSIS — I10 Essential (primary) hypertension: Secondary | ICD-10-CM | POA: Diagnosis not present

## 2022-03-20 DIAGNOSIS — I700 Atherosclerosis of aorta: Secondary | ICD-10-CM | POA: Diagnosis not present

## 2022-03-20 DIAGNOSIS — R768 Other specified abnormal immunological findings in serum: Secondary | ICD-10-CM | POA: Diagnosis not present

## 2022-03-20 DIAGNOSIS — M1991 Primary osteoarthritis, unspecified site: Secondary | ICD-10-CM | POA: Diagnosis not present

## 2022-03-20 DIAGNOSIS — E1122 Type 2 diabetes mellitus with diabetic chronic kidney disease: Secondary | ICD-10-CM | POA: Diagnosis not present

## 2022-03-20 DIAGNOSIS — K219 Gastro-esophageal reflux disease without esophagitis: Secondary | ICD-10-CM | POA: Diagnosis not present

## 2022-03-20 DIAGNOSIS — Z79899 Other long term (current) drug therapy: Secondary | ICD-10-CM | POA: Diagnosis not present

## 2022-03-20 DIAGNOSIS — Z0000 Encounter for general adult medical examination without abnormal findings: Secondary | ICD-10-CM | POA: Diagnosis not present

## 2022-03-20 DIAGNOSIS — R053 Chronic cough: Secondary | ICD-10-CM | POA: Diagnosis not present

## 2022-03-20 DIAGNOSIS — N182 Chronic kidney disease, stage 2 (mild): Secondary | ICD-10-CM | POA: Diagnosis not present

## 2022-03-20 DIAGNOSIS — K760 Fatty (change of) liver, not elsewhere classified: Secondary | ICD-10-CM | POA: Diagnosis not present

## 2022-10-03 DIAGNOSIS — M7918 Myalgia, other site: Secondary | ICD-10-CM | POA: Diagnosis not present

## 2022-10-03 DIAGNOSIS — E1122 Type 2 diabetes mellitus with diabetic chronic kidney disease: Secondary | ICD-10-CM | POA: Diagnosis not present

## 2022-10-03 DIAGNOSIS — D696 Thrombocytopenia, unspecified: Secondary | ICD-10-CM | POA: Diagnosis not present

## 2022-10-03 DIAGNOSIS — M25551 Pain in right hip: Secondary | ICD-10-CM | POA: Diagnosis not present

## 2022-10-03 DIAGNOSIS — I700 Atherosclerosis of aorta: Secondary | ICD-10-CM | POA: Diagnosis not present

## 2022-10-31 DIAGNOSIS — Z8719 Personal history of other diseases of the digestive system: Secondary | ICD-10-CM | POA: Diagnosis not present

## 2023-04-03 DIAGNOSIS — R42 Dizziness and giddiness: Secondary | ICD-10-CM | POA: Diagnosis not present

## 2023-04-03 DIAGNOSIS — Z0000 Encounter for general adult medical examination without abnormal findings: Secondary | ICD-10-CM | POA: Diagnosis not present

## 2023-04-03 DIAGNOSIS — Z79899 Other long term (current) drug therapy: Secondary | ICD-10-CM | POA: Diagnosis not present

## 2023-04-03 DIAGNOSIS — E1122 Type 2 diabetes mellitus with diabetic chronic kidney disease: Secondary | ICD-10-CM | POA: Diagnosis not present

## 2023-04-03 DIAGNOSIS — I700 Atherosclerosis of aorta: Secondary | ICD-10-CM | POA: Diagnosis not present

## 2023-04-03 DIAGNOSIS — E1142 Type 2 diabetes mellitus with diabetic polyneuropathy: Secondary | ICD-10-CM | POA: Diagnosis not present

## 2023-06-22 IMAGING — US US ABDOMINAL AORTA SCREENING AAA
1 series · 14 of 14 positions shown · non-contrast
Comparison: None.

CLINICAL DATA: Male between 65-75 years of age with a smoking
history.

EXAM:
US ABDOMINAL AORTA MEDICARE SCREENING
TECHNIQUE: Ultrasound examination of the abdominal aorta was performed as a
screening evaluation for abdominal aortic aneurysm.

[Series 1: us abdominal aorta screening aaa · 0.28mm/px · 14 of 14 slices shown]
[im 1/14]
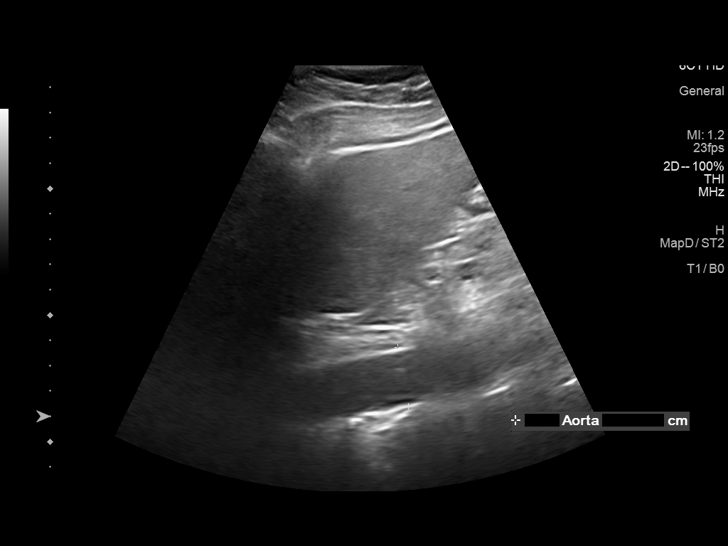
[im 2/14]
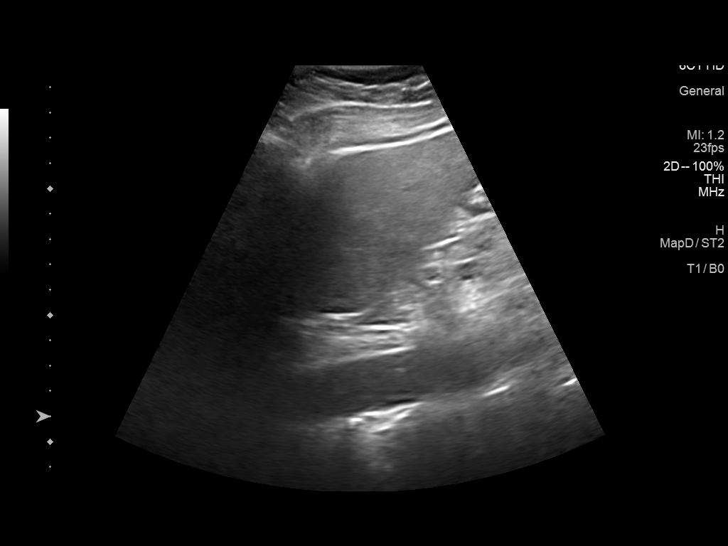
[im 3/14]
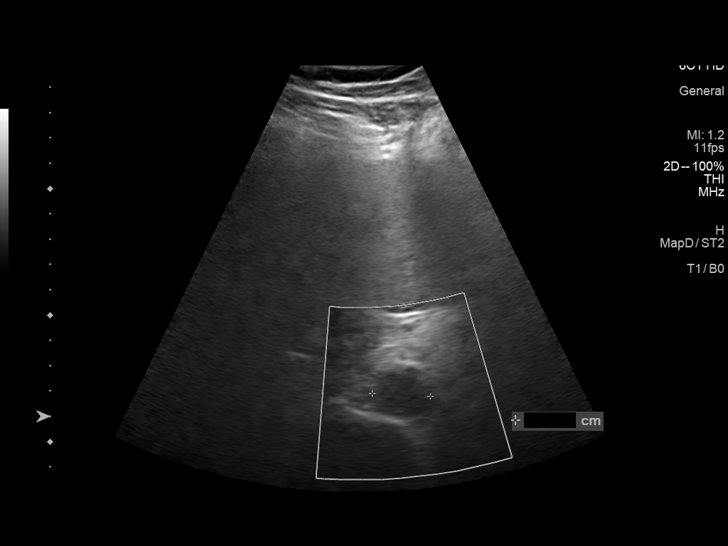
[im 4/14]
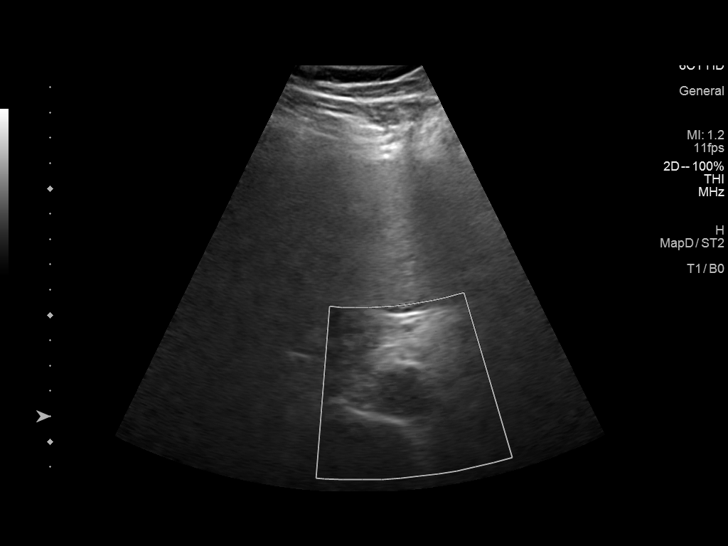
[im 5/14]
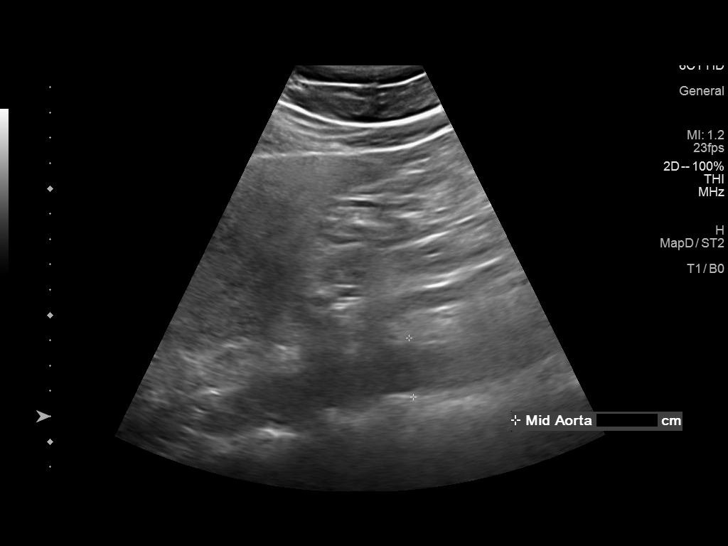
[im 6/14]
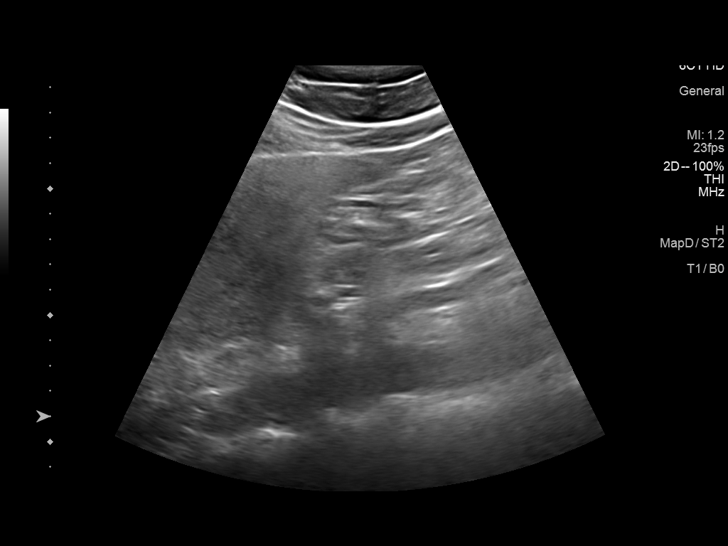
[im 7/14]
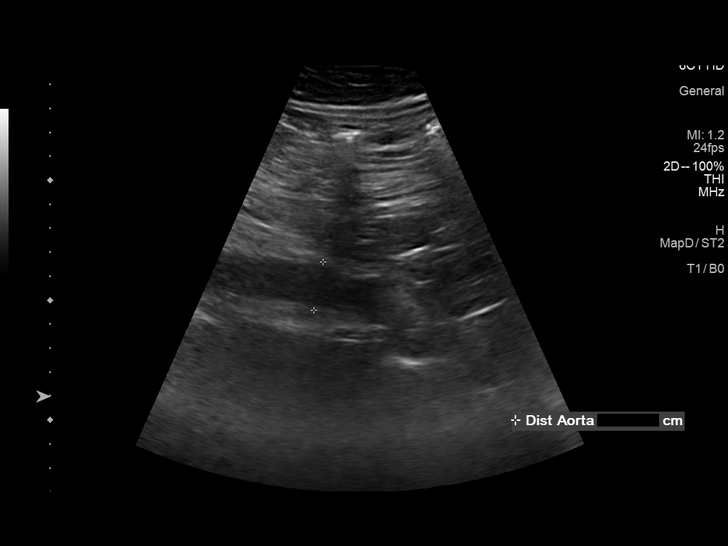
[im 8/14]
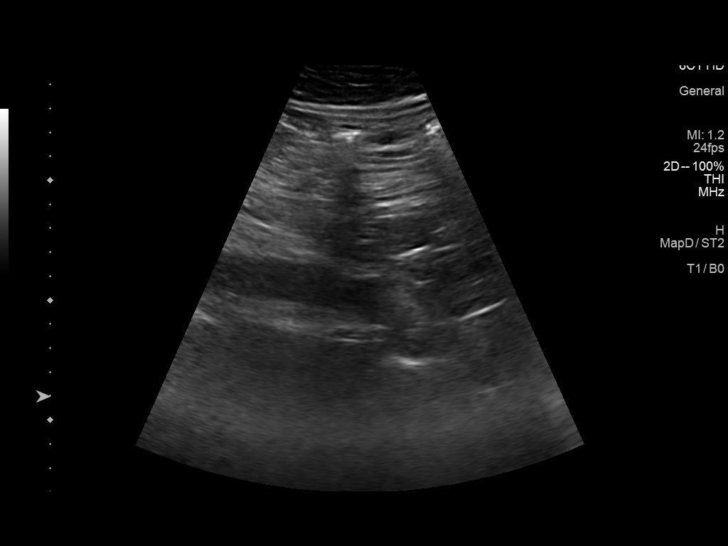
[im 9/14]
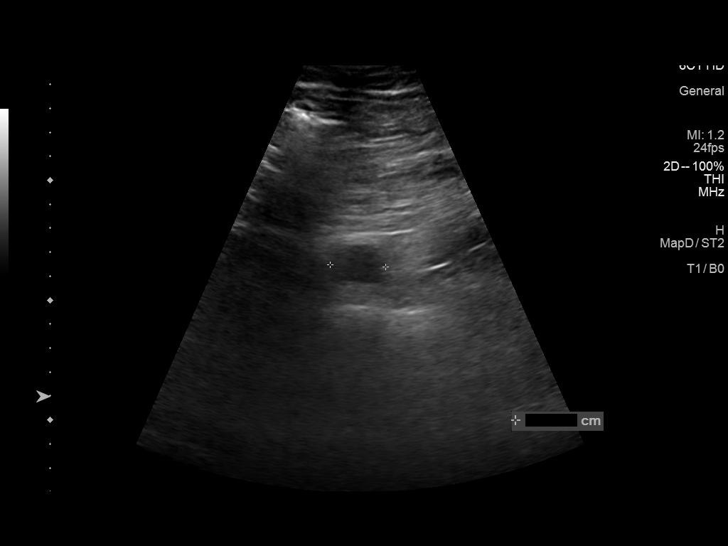
[im 10/14]
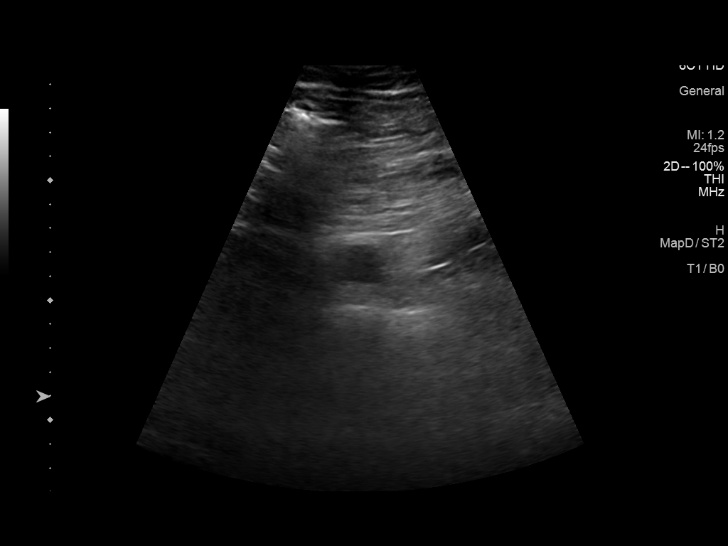
[im 11/14]
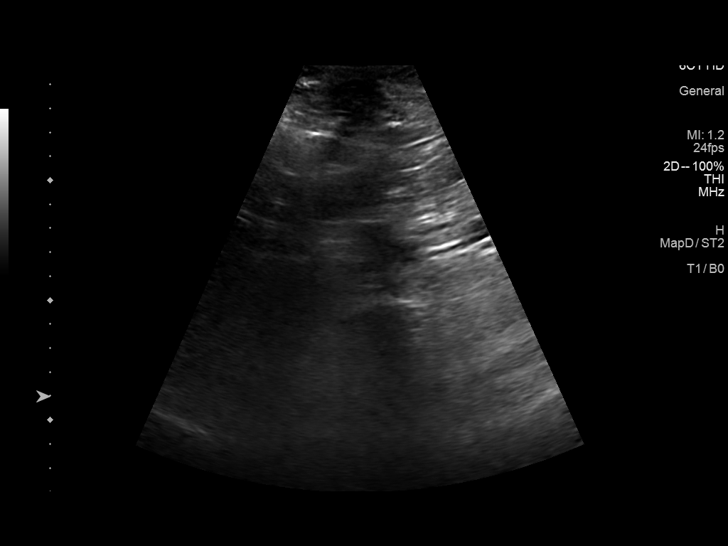
[im 12/14]
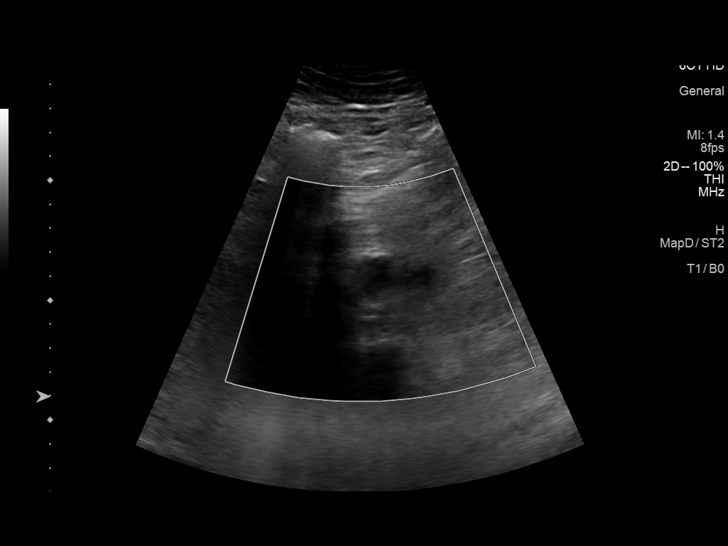
[im 13/14]
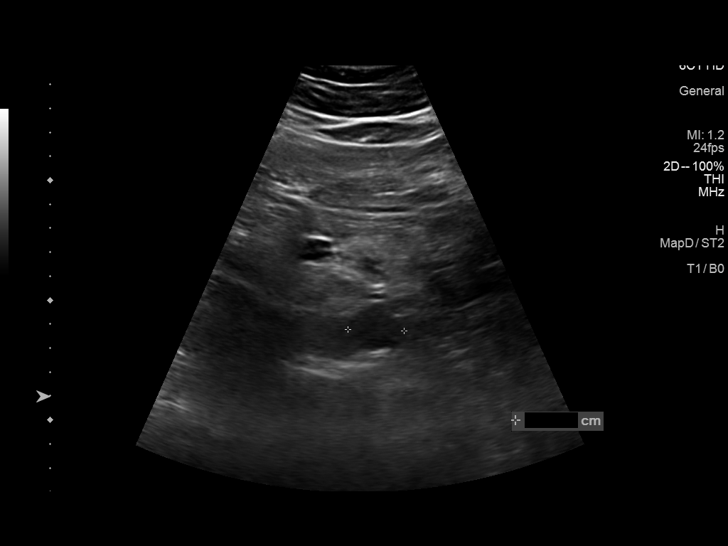
[im 14/14]
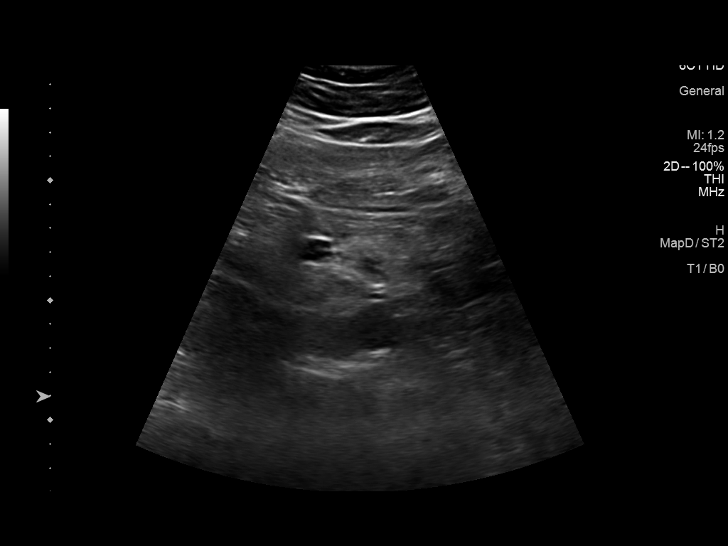

[14 of 14 positions shown; findings below may reference images not displayed]

FINDINGS: Abdominal aortic measurements as follows:

Proximal:  2.3 x 2.4 cm

Mid:  2.3 x 2.3 cm

Distal:  2.1 x 2.3 cm

Atherosclerosis visualized.
IMPRESSION: Atherosclerosis of the abdominal aorta without evidence of
aneurysmal disease.

## 2023-10-02 DIAGNOSIS — E119 Type 2 diabetes mellitus without complications: Secondary | ICD-10-CM | POA: Diagnosis not present

## 2023-10-02 DIAGNOSIS — I10 Essential (primary) hypertension: Secondary | ICD-10-CM | POA: Diagnosis not present

## 2023-10-02 DIAGNOSIS — M5431 Sciatica, right side: Secondary | ICD-10-CM | POA: Diagnosis not present

## 2023-10-02 DIAGNOSIS — I700 Atherosclerosis of aorta: Secondary | ICD-10-CM | POA: Diagnosis not present

## 2023-10-02 DIAGNOSIS — E782 Mixed hyperlipidemia: Secondary | ICD-10-CM | POA: Diagnosis not present

## 2023-10-16 DIAGNOSIS — M79604 Pain in right leg: Secondary | ICD-10-CM | POA: Diagnosis not present

## 2023-10-31 DIAGNOSIS — M5416 Radiculopathy, lumbar region: Secondary | ICD-10-CM | POA: Diagnosis not present

## 2023-11-15 DIAGNOSIS — E1122 Type 2 diabetes mellitus with diabetic chronic kidney disease: Secondary | ICD-10-CM | POA: Diagnosis not present

## 2023-11-15 DIAGNOSIS — E1142 Type 2 diabetes mellitus with diabetic polyneuropathy: Secondary | ICD-10-CM | POA: Diagnosis not present

## 2023-11-20 DIAGNOSIS — M5416 Radiculopathy, lumbar region: Secondary | ICD-10-CM | POA: Diagnosis not present

## 2023-11-20 DIAGNOSIS — M545 Low back pain, unspecified: Secondary | ICD-10-CM | POA: Diagnosis not present

## 2023-11-30 DIAGNOSIS — M545 Low back pain, unspecified: Secondary | ICD-10-CM | POA: Diagnosis not present

## 2023-11-30 DIAGNOSIS — M5416 Radiculopathy, lumbar region: Secondary | ICD-10-CM | POA: Diagnosis not present

## 2023-12-04 DIAGNOSIS — M5416 Radiculopathy, lumbar region: Secondary | ICD-10-CM | POA: Diagnosis not present

## 2023-12-04 DIAGNOSIS — M545 Low back pain, unspecified: Secondary | ICD-10-CM | POA: Diagnosis not present

## 2023-12-11 DIAGNOSIS — D124 Benign neoplasm of descending colon: Secondary | ICD-10-CM | POA: Diagnosis not present

## 2023-12-11 DIAGNOSIS — K5730 Diverticulosis of large intestine without perforation or abscess without bleeding: Secondary | ICD-10-CM | POA: Diagnosis not present

## 2023-12-11 DIAGNOSIS — Z860100 Personal history of colon polyps, unspecified: Secondary | ICD-10-CM | POA: Diagnosis not present

## 2023-12-11 DIAGNOSIS — Z09 Encounter for follow-up examination after completed treatment for conditions other than malignant neoplasm: Secondary | ICD-10-CM | POA: Diagnosis not present

## 2023-12-25 DIAGNOSIS — M5416 Radiculopathy, lumbar region: Secondary | ICD-10-CM | POA: Diagnosis not present

## 2023-12-25 DIAGNOSIS — M545 Low back pain, unspecified: Secondary | ICD-10-CM | POA: Diagnosis not present

## 2023-12-27 DIAGNOSIS — M545 Low back pain, unspecified: Secondary | ICD-10-CM | POA: Diagnosis not present

## 2023-12-27 DIAGNOSIS — M5416 Radiculopathy, lumbar region: Secondary | ICD-10-CM | POA: Diagnosis not present

## 2024-01-01 DIAGNOSIS — M5416 Radiculopathy, lumbar region: Secondary | ICD-10-CM | POA: Diagnosis not present

## 2024-01-01 DIAGNOSIS — M545 Low back pain, unspecified: Secondary | ICD-10-CM | POA: Diagnosis not present

## 2024-01-15 DIAGNOSIS — M5416 Radiculopathy, lumbar region: Secondary | ICD-10-CM | POA: Diagnosis not present

## 2024-01-15 DIAGNOSIS — M545 Low back pain, unspecified: Secondary | ICD-10-CM | POA: Diagnosis not present

## 2024-02-04 NOTE — Progress Notes (Addendum)
 Jay Reynolds                                          MRN: [id number]   03/10/2024   The [facility] Quality Team Specialist reviewed this patient medical record for the purposes of chart review for care gap closure. The following were reviewed: chart review for care gap closure-diabetic eye exam and kidney health evaluation for diabetes:eGFR  and uACR.    [facility] Quality Team
